# Patient Record
Sex: Female | Born: 1970 | Race: White | Hispanic: No | Marital: Married | Smoking: Never smoker
Health system: Southern US, Community
[De-identification: ages and names within clinical notes are randomized; demographics above are authoritative.]

## PROBLEM LIST (undated history)

## (undated) DIAGNOSIS — R7303 Prediabetes: Secondary | ICD-10-CM

## (undated) DIAGNOSIS — L409 Psoriasis, unspecified: Secondary | ICD-10-CM

## (undated) HISTORY — DX: Prediabetes: R73.03

## (undated) HISTORY — PX: TUBAL LIGATION: SHX77

## (undated) HISTORY — DX: Psoriasis, unspecified: L40.9

---

## 2008-03-20 ENCOUNTER — Ambulatory Visit: Payer: Self-pay | Admitting: Obstetrics & Gynecology

## 2009-10-25 LAB — HM PAP SMEAR

## 2011-01-25 ENCOUNTER — Ambulatory Visit: Payer: Self-pay | Admitting: Family Medicine

## 2012-08-31 ENCOUNTER — Ambulatory Visit (INDEPENDENT_AMBULATORY_CARE_PROVIDER_SITE_OTHER): Payer: BC Managed Care – PPO | Admitting: Internal Medicine

## 2012-08-31 ENCOUNTER — Encounter: Payer: Self-pay | Admitting: Internal Medicine

## 2012-08-31 VITALS — BP 120/78 | HR 63 | Temp 98.4°F | Ht 69.5 in | Wt 273.8 lb

## 2012-08-31 DIAGNOSIS — E669 Obesity, unspecified: Secondary | ICD-10-CM

## 2012-08-31 DIAGNOSIS — Z0000 Encounter for general adult medical examination without abnormal findings: Secondary | ICD-10-CM

## 2012-08-31 DIAGNOSIS — Z23 Encounter for immunization: Secondary | ICD-10-CM

## 2012-08-31 DIAGNOSIS — Z1239 Encounter for other screening for malignant neoplasm of breast: Secondary | ICD-10-CM

## 2012-08-31 LAB — TSH: TSH: 1.05 u[IU]/mL (ref 0.35–5.50)

## 2012-08-31 LAB — COMPREHENSIVE METABOLIC PANEL
ALT: 13 U/L (ref 0–35)
AST: 16 U/L (ref 0–37)
Alkaline Phosphatase: 58 U/L (ref 39–117)
CO2: 27 mEq/L (ref 19–32)
GFR: 90.14 mL/min (ref >60.00)
Sodium: 138 mEq/L (ref 135–145)
Total Bilirubin: 0.2 mg/dL — ABNORMAL LOW (ref 0.3–1.2)
Total Protein: 7.7 g/dL (ref 6.0–8.3)

## 2012-08-31 LAB — CBC WITH DIFFERENTIAL/PLATELET
Basophils Absolute: 0 10*3/uL (ref 0.0–0.1)
Eosinophils Absolute: 0.1 10*3/uL (ref 0.0–0.7)
HCT: 38.7 % (ref 36.0–46.0)
Lymphs Abs: 2.1 10*3/uL (ref 0.7–4.0)
MCHC: 32.1 g/dL (ref 30.0–36.0)
MCV: 82.5 fl (ref 78.0–100.0)
Monocytes Absolute: 0.6 10*3/uL (ref 0.1–1.0)
Platelets: 327 10*3/uL (ref 150.0–400.0)
RDW: 14.1 % (ref 11.5–14.6)

## 2012-08-31 LAB — LIPID PANEL: Cholesterol: 155 mg/dL (ref 0–200)

## 2012-08-31 NOTE — Assessment & Plan Note (Signed)
Mammogram scheduled today.  

## 2012-08-31 NOTE — Assessment & Plan Note (Signed)
BMI 39. Discussed healthy diet and caloric restriction. Discussed incorporating lean protein and reducing intake of processed carbohydrates. Also discussed increasing physical activity with goal of 5 hours per week. We discussed potential medications to help with appetite suppression, however patient like to hold off for now. Followup in one year or sooner as needed.

## 2012-08-31 NOTE — Progress Notes (Signed)
Subjective:    Patient ID: Erica Roy, female    DOB: 1970/11/17, 41 y.o.   MRN: [id number]  HPI 41 year old female presents to establish care. She reports that she is generally feeling well. She is very active caring for her two children who are 69 years old and 5 years old. 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 Her only concern is ongoing struggle with weight loss. She reports limited time for exercise because of multiple responsibilities. She tries to follow a healthy diet but does note some dietary indiscretion. She reports that she has lost weight in the past using Weight Watchers, however this was several years ago. She has not recently tried any dietary interventions. She has never taken medications to help with weight loss. Aside from this, she reports she is feeling well.  Outpatient Encounter Prescriptions as of 08/31/2012  Medication Sig Dispense Refill  . Multiple Vitamin (MULTIVITAMIN) tablet Take 1 tablet by mouth daily.       BP 120/78  Pulse 63  Temp 98.4 F (36.9 C) (Oral)  Ht 5' 9.5" (1.765 m)  Wt 273 lb 12 oz (124.172 kg)  BMI 39.85 kg/m2  SpO2 98%  LMP 08/23/2012  Review of Systems  Constitutional: Negative for fever, chills, appetite change, fatigue and unexpected weight change.  HENT: Negative for ear pain, congestion, sore throat, trouble swallowing, neck pain, voice change and sinus pressure.   Eyes: Negative for visual disturbance.  Respiratory: Negative for cough, shortness of breath, wheezing and stridor.   Cardiovascular: Negative for chest pain, palpitations and leg swelling.  Gastrointestinal: Negative for nausea, vomiting, abdominal pain, diarrhea, constipation, blood in stool, abdominal distention and anal bleeding.  Genitourinary: Negative for dysuria and flank pain.  Musculoskeletal: Negative for myalgias, arthralgias and gait problem.  Skin: Negative for color change and rash.  Neurological: Negative for dizziness  and headaches.  Hematological: Negative for adenopathy. Does not bruise/bleed easily.  Psychiatric/Behavioral: Negative for suicidal ideas, sleep disturbance and dysphoric mood. The patient is not nervous/anxious.        Objective:   Physical Exam  Constitutional: She is oriented to person, place, and time. She appears well-developed and well-nourished. No distress.  HENT:  Head: Normocephalic and atraumatic.  Right Ear: External ear normal.  Left Ear: External ear normal.  Nose: Nose normal.  Mouth/Throat: Oropharynx is clear and moist. No oropharyngeal exudate.  Eyes: Conjunctivae normal are normal. Pupils are equal, round, and reactive to light. Right eye exhibits no discharge. Left eye exhibits no discharge. No scleral icterus.  Neck: Normal range of motion. Neck supple. No tracheal deviation present. No thyromegaly present.  Cardiovascular: Normal rate, regular rhythm, normal heart sounds and intact distal pulses.  Exam reveals no gallop and no friction rub.   No murmur heard. Pulmonary/Chest: Effort normal and breath sounds normal. No respiratory distress. She has no wheezes. She has no rales. She exhibits no tenderness.  Abdominal: Soft. Bowel sounds are normal. She exhibits no distension and no mass. There is no tenderness. There is no rebound and no guarding.  Musculoskeletal: Normal range of motion. She exhibits no edema and no tenderness.  Lymphadenopathy:    She has no cervical adenopathy.  Neurological: She is alert and oriented to person, place, and time. No cranial nerve deficit. She exhibits normal muscle tone. Coordination normal.  Skin: Skin is warm and dry. No rash noted. She is not diaphoretic. No erythema. No pallor.  Psychiatric: She has a normal mood  and affect. Her behavior is normal. Judgment and thought content normal.          Assessment & Plan:

## 2012-09-01 LAB — VITAMIN D 25 HYDROXY (VIT D DEFICIENCY, FRACTURES): Vit D, 25-Hydroxy: 31 ng/mL (ref 30–89)

## 2012-09-04 ENCOUNTER — Ambulatory Visit (INDEPENDENT_AMBULATORY_CARE_PROVIDER_SITE_OTHER): Payer: BC Managed Care – PPO | Admitting: *Deleted

## 2012-09-04 DIAGNOSIS — Z111 Encounter for screening for respiratory tuberculosis: Secondary | ICD-10-CM

## 2012-09-07 LAB — TB SKIN TEST: TB Skin Test: NEGATIVE

## 2012-09-27 ENCOUNTER — Ambulatory Visit: Payer: Self-pay | Admitting: Internal Medicine

## 2012-10-06 ENCOUNTER — Ambulatory Visit (INDEPENDENT_AMBULATORY_CARE_PROVIDER_SITE_OTHER): Payer: BC Managed Care – PPO | Admitting: Internal Medicine

## 2012-10-06 ENCOUNTER — Encounter: Payer: Self-pay | Admitting: Internal Medicine

## 2012-10-06 VITALS — BP 122/76 | HR 71 | Temp 98.1°F | Ht 69.0 in | Wt 277.5 lb

## 2012-10-06 DIAGNOSIS — J0100 Acute maxillary sinusitis, unspecified: Secondary | ICD-10-CM

## 2012-10-06 MED ORDER — AMOXICILLIN-POT CLAVULANATE 875-125 MG PO TABS: 1.0000 | ORAL_TABLET | Freq: Two times a day (BID) | ORAL | Status: DC

## 2012-10-06 NOTE — Assessment & Plan Note (Signed)
Symptoms and exam are consistent with bilateral maxillary sinusitis. Will treat with Augmentin. Recommended that patient start Claritin-D 12 hour twice daily. Patient will call if symptoms are not improving over the next 72 hours. No improvement, would favor starting prednisone.

## 2012-10-06 NOTE — Progress Notes (Signed)
  Subjective:    Patient ID: Erica Roy, female    DOB: [birth year], 41 y.o.   MRN: [id number]  HPI 41 year old female presents for acute visit complaining of one-week history of sinus congestion, sinus pressure, and headache. She reports her symptoms began 1 week ago with fever, chills, general malaise, and nasal drainage. She had some occasional cough productive of purulent sputum. Over the course of the week, her symptoms persisted with increasing sinus pressure and maxillary she also reports pain in her teeth. She has had difficulty chewing because of sinus pain/pressure. She has been taking Ibuprofen 800mg  up to three times daily with no improvement.  Outpatient Encounter Prescriptions as of 10/06/2012  Medication Sig Dispense Refill  . Multiple Vitamin (MULTIVITAMIN) tablet Take 1 tablet by mouth daily.      Marland Kitchen amoxicillin-clavulanate (AUGMENTIN) 875-125 MG per tablet Take 1 tablet by mouth 2 (two) times daily.  20 tablet  0   BP 122/76  Pulse 71  Temp 98.1 F (36.7 C) (Oral)  Ht 5\' 9"  (1.753 m)  Wt 277 lb 8 oz (125.873 kg)  BMI 40.98 kg/m2  SpO2 98%  LMP 09/20/2012  Review of Systems  Constitutional: Positive for fever, chills and fatigue. Negative for unexpected weight change.  HENT: Positive for congestion, postnasal drip and sinus pressure. Negative for hearing loss, ear pain, nosebleeds, sore throat, facial swelling, rhinorrhea, sneezing, mouth sores, trouble swallowing, neck pain, neck stiffness, voice change, tinnitus and ear discharge.   Eyes: Negative for pain, discharge, redness and visual disturbance.  Respiratory: Positive for cough. Negative for chest tightness, shortness of breath, wheezing and stridor.   Cardiovascular: Negative for chest pain, palpitations and leg swelling.  Musculoskeletal: Negative for myalgias and arthralgias.  Skin: Negative for color change and rash.  Neurological: Positive for headaches. Negative for dizziness, weakness and light-headedness.   Hematological: Negative for adenopathy.       Objective:   Physical Exam  Constitutional: She is oriented to person, place, and time. She appears well-developed and well-nourished. No distress.  HENT:  Head: Normocephalic and atraumatic.  Right Ear: External ear normal. A middle ear effusion is present.  Left Ear: External ear normal. A middle ear effusion is present.  Nose: Mucosal edema present. Right sinus exhibits maxillary sinus tenderness. Left sinus exhibits maxillary sinus tenderness.  Mouth/Throat: Oropharynx is clear and moist. No oropharyngeal exudate.  Eyes: Conjunctivae normal are normal. Pupils are equal, round, and reactive to light. Right eye exhibits no discharge. Left eye exhibits no discharge. No scleral icterus.  Neck: Normal range of motion. Neck supple. No tracheal deviation present. No thyromegaly present.  Cardiovascular: Normal rate, regular rhythm, normal heart sounds and intact distal pulses.  Exam reveals no gallop and no friction rub.   No murmur heard. Pulmonary/Chest: Effort normal and breath sounds normal. No respiratory distress. She has no wheezes. She has no rales. She exhibits no tenderness.  Musculoskeletal: Normal range of motion. She exhibits no edema and no tenderness.  Lymphadenopathy:    She has no cervical adenopathy.  Neurological: She is alert and oriented to person, place, and time. No cranial nerve deficit. She exhibits normal muscle tone. Coordination normal.  Skin: Skin is warm and dry. No rash noted. She is not diaphoretic. No erythema. No pallor.  Psychiatric: She has a normal mood and affect. Her behavior is normal. Judgment and thought content normal.          Assessment & Plan:

## 2012-10-06 NOTE — Patient Instructions (Signed)
Start Augmentin and Claritin-D 12 hour for congestion. Continue Ibuprofen. Call or email Monday if no improvement.

## 2012-10-09 ENCOUNTER — Encounter: Payer: Self-pay | Admitting: Internal Medicine

## 2013-05-16 ENCOUNTER — Encounter (HOSPITAL_COMMUNITY): Payer: Self-pay | Admitting: Pharmacy Technician

## 2013-08-30 ENCOUNTER — Other Ambulatory Visit: Payer: Self-pay

## 2013-10-08 ENCOUNTER — Ambulatory Visit (INDEPENDENT_AMBULATORY_CARE_PROVIDER_SITE_OTHER): Payer: BC Managed Care – PPO | Admitting: Internal Medicine

## 2013-10-08 ENCOUNTER — Encounter: Payer: Self-pay | Admitting: Internal Medicine

## 2013-10-08 ENCOUNTER — Encounter: Payer: Self-pay | Admitting: Emergency Medicine

## 2013-10-08 ENCOUNTER — Other Ambulatory Visit (HOSPITAL_COMMUNITY)
Admission: RE | Admit: 2013-10-08 | Discharge: 2013-10-08 | Disposition: A | Discharge status: Home / Self Care | Payer: BC Managed Care – PPO | Source: Ambulatory Visit | Attending: Internal Medicine | Admitting: Internal Medicine

## 2013-10-08 VITALS — BP 120/70 | HR 70 | Temp 98.0°F | Ht 69.0 in

## 2013-10-08 DIAGNOSIS — Z1151 Encounter for screening for human papillomavirus (HPV): Secondary | ICD-10-CM | POA: Insufficient documentation

## 2013-10-08 DIAGNOSIS — Z0000 Encounter for general adult medical examination without abnormal findings: Secondary | ICD-10-CM | POA: Insufficient documentation

## 2013-10-08 DIAGNOSIS — E669 Obesity, unspecified: Secondary | ICD-10-CM

## 2013-10-08 DIAGNOSIS — E663 Overweight: Secondary | ICD-10-CM

## 2013-10-08 DIAGNOSIS — Z01419 Encounter for gynecological examination (general) (routine) without abnormal findings: Secondary | ICD-10-CM | POA: Insufficient documentation

## 2013-10-08 DIAGNOSIS — Z1239 Encounter for other screening for malignant neoplasm of breast: Secondary | ICD-10-CM

## 2013-10-08 LAB — CBC WITH DIFFERENTIAL/PLATELET
Eosinophils Relative: 1.7 % (ref 0.0–5.0)
HCT: 36.7 % (ref 36.0–46.0)
Hemoglobin: 12.2 g/dL (ref 12.0–15.0)
Lymphs Abs: 2 10*3/uL (ref 0.7–4.0)
Monocytes Relative: 6.4 % (ref 3.0–12.0)
Neutro Abs: 6.2 10*3/uL (ref 1.4–7.7)
RBC: 4.65 Mil/uL (ref 3.87–5.11)
WBC: 9 10*3/uL (ref 4.5–10.5)

## 2013-10-08 LAB — LIPID PANEL
HDL: 37.2 mg/dL — ABNORMAL LOW (ref >39.00)
LDL Cholesterol: 107 mg/dL — ABNORMAL HIGH (ref 0–99)
Total CHOL/HDL Ratio: 4
Triglycerides: 70 mg/dL (ref 0.0–149.0)

## 2013-10-08 LAB — COMPREHENSIVE METABOLIC PANEL
CO2: 25 mEq/L (ref 19–32)
Creatinine, Ser: 0.8 mg/dL (ref 0.4–1.2)
GFR: 79.77 mL/min (ref >60.00)
Glucose, Bld: 92 mg/dL (ref 70–99)
Total Bilirubin: 0.3 mg/dL (ref 0.3–1.2)

## 2013-10-08 LAB — HM PAP SMEAR: HM PAP: NEGATIVE

## 2013-10-08 MED ORDER — PHENTERMINE HCL 37.5 MG PO CAPS: 37.5000 mg | ORAL_CAPSULE | ORAL | Status: DC

## 2013-10-08 NOTE — Assessment & Plan Note (Signed)
General medical exam normal today including breast and pelvic exam. PAP pending. Immunizations are UTD. Will check labs including CBC, CMP, lipids, TSH, Vit D. Encouraged healthy diet and regular exercise with goal of weight loss. Mammogram ordered.

## 2013-10-08 NOTE — Addendum Note (Signed)
Addended by: Montine Circle D on: 10/08/2013 10:02 AM   Modules accepted: Orders

## 2013-10-08 NOTE — Progress Notes (Signed)
Subjective:    Patient ID: Erica Roy, female    DOB: 1971/02/01, 42 y.o.   MRN: [id number]  HPI 42YO female presents for annual exam. Feeling well. Continues to struggle with weight loss. Has tried Weight Watchers with no improvement. Signed up with personal trainer 3days per week. Trying to limit carbohydrates.  No other concerns today.  Outpatient Encounter Prescriptions as of 10/08/2013  Medication Sig  . Multiple Vitamin (MULTIVITAMIN) tablet Take 1 tablet by mouth daily.   BP 120/70  Pulse 70  Temp(Src) 98 F (36.7 C) (Oral)  Ht 5\' 9"  (1.753 m)  SpO2 98%  LMP 09/04/2013  Review of Systems  Constitutional: Negative for fever, chills, appetite change, fatigue and unexpected weight change.  HENT: Negative for congestion, ear pain, sinus pressure, sore throat, trouble swallowing and voice change.   Eyes: Negative for visual disturbance.  Respiratory: Negative for cough, shortness of breath, wheezing and stridor.   Cardiovascular: Negative for chest pain, palpitations and leg swelling.  Gastrointestinal: Negative for nausea, vomiting, abdominal pain, diarrhea, constipation, blood in stool, abdominal distention and anal bleeding.  Genitourinary: Negative for dysuria and flank pain.  Musculoskeletal: Negative for arthralgias, gait problem, myalgias and neck pain.  Skin: Negative for color change and rash.  Neurological: Negative for dizziness and headaches.  Hematological: Negative for adenopathy. Does not bruise/bleed easily.  Psychiatric/Behavioral: Negative for suicidal ideas, sleep disturbance and dysphoric mood. The patient is not nervous/anxious.        Objective:   Physical Exam  Constitutional: She is oriented to person, place, and time. She appears well-developed and well-nourished. No distress.  HENT:  Head: Normocephalic and atraumatic.  Right Ear: External ear normal.  Left Ear: External ear normal.  Nose: Nose normal.  Mouth/Throat: Oropharynx is clear and  moist. No oropharyngeal exudate.  Eyes: Conjunctivae are normal. Pupils are equal, round, and reactive to light. Right eye exhibits no discharge. Left eye exhibits no discharge. No scleral icterus.  Neck: Normal range of motion. Neck supple. No tracheal deviation present. No thyromegaly present.  Cardiovascular: Normal rate, regular rhythm, normal heart sounds and intact distal pulses.  Exam reveals no gallop and no friction rub.   No murmur heard. Pulmonary/Chest: Effort normal and breath sounds normal. No accessory muscle usage. Not tachypneic. No respiratory distress. She has no decreased breath sounds. She has no wheezes. She has no rhonchi. She has no rales. She exhibits no tenderness.  Abdominal: Soft. Bowel sounds are normal. She exhibits no distension and no mass. There is no tenderness. There is no rebound and no guarding.  Genitourinary: Rectum normal, vagina normal and uterus normal. No breast swelling, tenderness, discharge or bleeding. Pelvic exam was performed with patient supine. There is no rash, tenderness or lesion on the right labia. There is no rash, tenderness or lesion on the left labia. Uterus is not enlarged and not tender. Cervix exhibits no motion tenderness, no discharge and no friability. Right adnexum displays no mass, no tenderness and no fullness. Left adnexum displays no mass, no tenderness and no fullness. No erythema or tenderness around the vagina. No vaginal discharge found.  Musculoskeletal: Normal range of motion. She exhibits no edema and no tenderness.  Lymphadenopathy:    She has no cervical adenopathy.  Neurological: She is alert and oriented to person, place, and time. No cranial nerve deficit. She exhibits normal muscle tone. Coordination normal.  Skin: Skin is warm and dry. No rash noted. She is not diaphoretic. No erythema. No pallor.  Psychiatric: She has a normal mood and affect. Her behavior is normal. Judgment and thought content normal.            Assessment & Plan:

## 2013-10-08 NOTE — Progress Notes (Signed)
Pre-visit discussion using our clinic review tool. No additional management support is needed unless otherwise documented below in the visit note.  

## 2013-10-08 NOTE — Assessment & Plan Note (Signed)
Will start phentermine to help with appetite suppression. Plan follow up in 4 weeks for weight and BP check. (note that we could not recheck weight today because scale not working, but pt estimates 278lb)

## 2013-10-09 ENCOUNTER — Encounter: Payer: Self-pay | Admitting: *Deleted

## 2013-10-11 ENCOUNTER — Ambulatory Visit: Payer: Self-pay | Admitting: Internal Medicine

## 2013-10-11 LAB — HM MAMMOGRAPHY: HM Mammogram: NORMAL

## 2013-11-05 ENCOUNTER — Ambulatory Visit (INDEPENDENT_AMBULATORY_CARE_PROVIDER_SITE_OTHER): Payer: BC Managed Care – PPO | Admitting: Internal Medicine

## 2013-11-05 ENCOUNTER — Encounter: Payer: Self-pay | Admitting: Internal Medicine

## 2013-11-05 VITALS — BP 110/70 | HR 79 | Temp 98.2°F | Ht 69.0 in | Wt 275.5 lb

## 2013-11-05 DIAGNOSIS — E663 Overweight: Secondary | ICD-10-CM

## 2013-11-05 DIAGNOSIS — E669 Obesity, unspecified: Secondary | ICD-10-CM

## 2013-11-05 MED ORDER — PHENTERMINE HCL 37.5 MG PO CAPS: 37.5000 mg | ORAL_CAPSULE | ORAL | Status: DC

## 2013-11-05 NOTE — Progress Notes (Signed)
   Subjective:    Patient ID: Erica Roy, female    DOB: 07/08/1971, 43 y.o.   MRN: [id number]  HPI 43 year old female with history of obesity presents for followup. At her last visit, she was started on phentermine. She reports she has tolerated this medication well. She has noted dry mouth and has increased her fluid intake. She continues to try to follow a healthy diet. She is exercising with a trainer once per week and has started a fitness challenge with her husband. She is feeling well and denies any new concerns today.  Outpatient Encounter Prescriptions as of 11/05/2013  Medication Sig  . Multiple Vitamin (MULTIVITAMIN) tablet Take 1 tablet by mouth daily.  . phentermine 37.5 MG capsule Take 1 capsule (37.5 mg total) by mouth every morning.   BP 110/70  Pulse 79  Temp(Src) 98.2 F (36.8 C) (Oral)  Ht 5\' 9"  (1.753 m)  Wt 275 lb 8 oz (124.966 kg)  BMI 40.67 kg/m2  SpO2 96%  LMP 09/04/2013  Review of Systems  Constitutional: Negative for fever, chills, appetite change, fatigue and unexpected weight change.  HENT: Negative for congestion, ear pain, sinus pressure, sore throat, trouble swallowing and voice change.   Eyes: Negative for visual disturbance.  Respiratory: Negative for cough, shortness of breath, wheezing and stridor.   Cardiovascular: Negative for chest pain, palpitations and leg swelling.  Gastrointestinal: Negative for nausea, vomiting, abdominal pain, diarrhea, constipation, blood in stool, abdominal distention and anal bleeding.  Genitourinary: Negative for dysuria and flank pain.  Musculoskeletal: Negative for arthralgias, gait problem, myalgias and neck pain.  Skin: Negative for color change and rash.  Neurological: Negative for dizziness and headaches.  Hematological: Negative for adenopathy. Does not bruise/bleed easily.  Psychiatric/Behavioral: Negative for suicidal ideas, sleep disturbance and dysphoric mood. The patient is not nervous/anxious.          Objective:   Physical Exam  Constitutional: She is oriented to person, place, and time. She appears well-developed and well-nourished. No distress.  HENT:  Head: Normocephalic and atraumatic.  Right Ear: External ear normal.  Left Ear: External ear normal.  Nose: Nose normal.  Mouth/Throat: Oropharynx is clear and moist. No oropharyngeal exudate.  Eyes: Conjunctivae are normal. Pupils are equal, round, and reactive to light. Right eye exhibits no discharge. Left eye exhibits no discharge. No scleral icterus.  Neck: Normal range of motion. Neck supple. No tracheal deviation present. No thyromegaly present.  Cardiovascular: Normal rate, regular rhythm, normal heart sounds and intact distal pulses.  Exam reveals no gallop and no friction rub.   No murmur heard. Pulmonary/Chest: Effort normal and breath sounds normal. No accessory muscle usage. Not tachypneic. No respiratory distress. She has no decreased breath sounds. She has no wheezes. She has no rhonchi. She has no rales. She exhibits no tenderness.  Musculoskeletal: Normal range of motion. She exhibits no edema and no tenderness.  Lymphadenopathy:    She has no cervical adenopathy.  Neurological: She is alert and oriented to person, place, and time. No cranial nerve deficit. She exhibits normal muscle tone. Coordination normal.  Skin: Skin is warm and dry. No rash noted. She is not diaphoretic. No erythema. No pallor.  Psychiatric: She has a normal mood and affect. Her behavior is normal. Judgment and thought content normal.          Assessment & Plan:

## 2013-11-05 NOTE — Progress Notes (Signed)
Pre-visit discussion using our clinic review tool. No additional management support is needed unless otherwise documented below in the visit note.  

## 2013-11-05 NOTE — Assessment & Plan Note (Signed)
Wt Readings from Last 3 Encounters:  11/05/13 275 lb 8 oz (124.966 kg)  10/06/12 277 lb 8 oz (125.873 kg)  08/31/12 273 lb 12 oz (124.172 kg)   Body mass index is 40.67 kg/(m^2). Congratulated patient on weight loss. Encouraged her to continue efforts at healthy diet and regular physical activity. Will continue phentermine for appetite suppression. Plan followup in 2-3 months.

## 2013-11-07 ENCOUNTER — Encounter: Payer: Self-pay | Admitting: Internal Medicine

## 2014-02-08 ENCOUNTER — Ambulatory Visit: Payer: BC Managed Care – PPO | Admitting: Internal Medicine

## 2014-06-25 ENCOUNTER — Ambulatory Visit (INDEPENDENT_AMBULATORY_CARE_PROVIDER_SITE_OTHER): Payer: BC Managed Care – PPO | Admitting: *Deleted

## 2014-06-25 DIAGNOSIS — Z111 Encounter for screening for respiratory tuberculosis: Secondary | ICD-10-CM

## 2014-06-27 LAB — TB SKIN TEST
Induration: 0 mm
TB Skin Test: NEGATIVE

## 2014-10-10 ENCOUNTER — Encounter: Payer: BC Managed Care – PPO | Admitting: Internal Medicine

## 2014-10-11 ENCOUNTER — Ambulatory Visit (INDEPENDENT_AMBULATORY_CARE_PROVIDER_SITE_OTHER): Payer: BC Managed Care – PPO | Admitting: Internal Medicine

## 2014-10-11 ENCOUNTER — Encounter: Payer: Self-pay | Admitting: Internal Medicine

## 2014-10-11 VITALS — BP 118/76 | HR 78 | Temp 98.2°F | Ht 69.25 in | Wt 283.5 lb

## 2014-10-11 DIAGNOSIS — L219 Seborrheic dermatitis, unspecified: Secondary | ICD-10-CM | POA: Insufficient documentation

## 2014-10-11 DIAGNOSIS — Z0000 Encounter for general adult medical examination without abnormal findings: Secondary | ICD-10-CM

## 2014-10-11 DIAGNOSIS — L218 Other seborrheic dermatitis: Secondary | ICD-10-CM

## 2014-10-11 LAB — CBC WITH DIFFERENTIAL/PLATELET
Basophils Absolute: 0 10*3/uL (ref 0.0–0.1)
Basophils Relative: 0.3 % (ref 0.0–3.0)
EOS PCT: 0.7 % (ref 0.0–5.0)
Eosinophils Absolute: 0 10*3/uL (ref 0.0–0.7)
HCT: 37.7 % (ref 36.0–46.0)
Hemoglobin: 12 g/dL (ref 12.0–15.0)
Lymphocytes Relative: 24.1 % (ref 12.0–46.0)
Lymphs Abs: 1.8 10*3/uL (ref 0.7–4.0)
MCHC: 31.9 g/dL (ref 30.0–36.0)
MCV: 80.7 fl (ref 78.0–100.0)
Monocytes Absolute: 0.5 10*3/uL (ref 0.1–1.0)
Monocytes Relative: 6.3 % (ref 3.0–12.0)
NEUTROS PCT: 68.6 % (ref 43.0–77.0)
Neutro Abs: 5.1 10*3/uL (ref 1.4–7.7)
PLATELETS: 314 10*3/uL (ref 150.0–400.0)
RBC: 4.67 Mil/uL (ref 3.87–5.11)
RDW: 14.7 % (ref 11.5–15.5)
WBC: 7.4 10*3/uL (ref 4.0–10.5)

## 2014-10-11 LAB — LIPID PANEL
Cholesterol: 159 mg/dL (ref 0–200)
HDL: 35.4 mg/dL — ABNORMAL LOW (ref >39.00)
LDL Cholesterol: 97 mg/dL (ref 0–99)
NonHDL: 123.6
Total CHOL/HDL Ratio: 4
Triglycerides: 133 mg/dL (ref 0.0–149.0)
VLDL: 26.6 mg/dL (ref 0.0–40.0)

## 2014-10-11 LAB — COMPREHENSIVE METABOLIC PANEL
ALT: 13 U/L (ref 0–35)
AST: 18 U/L (ref 0–37)
Albumin: 3.9 g/dL (ref 3.5–5.2)
Alkaline Phosphatase: 61 U/L (ref 39–117)
BUN: 13 mg/dL (ref 6–23)
CALCIUM: 9 mg/dL (ref 8.4–10.5)
CO2: 25 meq/L (ref 19–32)
Chloride: 104 mEq/L (ref 96–112)
Creatinine, Ser: 0.7 mg/dL (ref 0.4–1.2)
GFR: 96.64 mL/min (ref >60.00)
Glucose, Bld: 127 mg/dL — ABNORMAL HIGH (ref 70–99)
Potassium: 3.8 mEq/L (ref 3.5–5.1)
SODIUM: 137 meq/L (ref 135–145)
TOTAL PROTEIN: 7.1 g/dL (ref 6.0–8.3)
Total Bilirubin: 0.7 mg/dL (ref 0.2–1.2)

## 2014-10-11 LAB — HEMOGLOBIN A1C: HEMOGLOBIN A1C: 6.7 % — AB (ref 4.6–6.5)

## 2014-10-11 LAB — MICROALBUMIN / CREATININE URINE RATIO
Creatinine,U: 167.7 mg/dL
Microalb Creat Ratio: 0.3 mg/g (ref 0.0–30.0)
Microalb, Ur: 0.5 mg/dL (ref 0.0–1.9)

## 2014-10-11 MED ORDER — SELENIUM SULFIDE 2.25 % EX SHAM: 1.0000 "application " | MEDICATED_SHAMPOO | CUTANEOUS | Status: DC
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
## 2014-10-11 NOTE — Assessment & Plan Note (Signed)
Will start Selenium sulfide shampoo. Pt will call if no improvement.

## 2014-10-11 NOTE — Assessment & Plan Note (Signed)
General medical exam normal today including breast exam. PAP and pelvic deferred as PAP normal 2014, HPV neg. Immunizations are UTD. Mammogram ordered. Labs today CBC, CMP, lipids, TSH, A1c. Follow up 1 year and prn.

## 2014-10-11 NOTE — Progress Notes (Signed)
Pre visit review using our clinic review tool, if applicable. No additional management support is needed unless otherwise documented below in the visit note. 

## 2014-10-11 NOTE — Patient Instructions (Addendum)
Consider nutrition counseling either at [facility] or at [facility].  Health Maintenance Adopting a healthy lifestyle and getting preventive care can go a long way to promote health and wellness. Talk with your health care provider about what schedule of regular examinations is right for you. This is a good chance for you to check in with your provider about disease prevention and staying healthy. In between checkups, there are plenty of things you can do on your own. Experts have done a lot of research about which lifestyle changes and preventive measures are most likely to keep you healthy. Ask your health care provider for more information. WEIGHT AND DIET  Eat a healthy diet  Be sure to include plenty of vegetables, fruits, low-fat dairy products, and lean protein.  Do not eat a lot of foods high in solid fats, added sugars, or salt.  Get regular exercise. This is one of the most important things you can do for your health.  Most adults should exercise for at least 150 minutes each week. The exercise should increase your heart rate and make you sweat (moderate-intensity exercise).  Most adults should also do strengthening exercises at least twice a week. This is in addition to the moderate-intensity exercise.  Maintain a healthy weight  Body mass index (BMI) is a measurement that can be used to identify possible weight problems. It estimates body fat based on height and weight. Your health care provider can help determine your BMI and help you achieve or maintain a healthy weight.  For females 43 years of age and older:   A BMI below 18.5 is considered underweight.  A BMI of 18.5 to 24.9 is normal.  A BMI of 25 to 29.9 is considered overweight.  A BMI of 30 and above is considered obese.  Watch levels of cholesterol and blood lipids  You should start having your blood tested for lipids and cholesterol at 43 years of age, then have this test every 5 years.  You may need to  have your cholesterol levels checked more often if:  Your lipid or cholesterol levels are high.  You are older than 43 years of age.  You are at high risk for heart disease.  CANCER SCREENING   Lung Cancer  Lung cancer screening is recommended for adults 43-45 years old who are at high risk for lung cancer because of a history of smoking.  A yearly low-dose CT scan of the lungs is recommended for people who:  Currently smoke.  Have quit within the past 15 years.  Have at least a 30-pack-year history of smoking. A pack year is smoking an average of one pack of cigarettes a day for 1 year.  Yearly screening should continue until it has been 15 years since you quit.  Yearly screening should stop if you develop a health problem that would prevent you from having lung cancer treatment.  Breast Cancer  Practice breast self-awareness. This means understanding how your breasts normally appear and feel.  It also means doing regular breast self-exams. Let your health care provider know about any changes, no matter how small.  If you are in your 43s or 30s, you should have a clinical breast exam (CBE) by a health care provider every 1-3 years as part of a regular health exam.  If you are 43 or older, have a CBE every year. Also consider having a breast X-ray (mammogram) every year.  If you have a family history of breast cancer, talk to  care provider about genetic screening.  If you are at high risk for breast cancer, talk to your health care provider about having an MRI and a mammogram every year.  Breast cancer gene (BRCA) assessment is recommended for women who have family members with BRCA-related cancers. BRCA-related cancers include:  Breast.  Ovarian.  Tubal.  Peritoneal cancers.  Results of the assessment will determine the need for genetic counseling and BRCA1 and BRCA2 testing. Cervical Cancer Routine pelvic examinations to screen for cervical cancer are no  longer recommended for nonpregnant women who are considered low risk for cancer of the pelvic organs (ovaries, uterus, and vagina) and who do not have symptoms. A pelvic examination may be necessary if you have symptoms including those associated with pelvic infections. Ask your health care provider if a screening pelvic exam is right for you.   The Pap test is the screening test for cervical cancer for women who are considered at risk.  If you had a hysterectomy for a problem that was not cancer or a condition that could lead to cancer, then you no longer need Pap tests.  If you are older than 65 years, and you have had normal Pap tests for the past 10 years, you no longer need to have Pap tests.  If you have had past treatment for cervical cancer or a condition that could lead to cancer, you need Pap tests and screening for cancer for at least 20 years after your treatment.  If you no longer get a Pap test, assess your risk factors if they change (such as having a new sexual partner). This can affect whether you should start being screened again.  Some women have medical problems that increase their chance of getting cervical cancer. If this is the case for you, your health care provider may recommend more frequent screening and Pap tests.  The human papillomavirus (HPV) test is another test that may be used for cervical cancer screening. The HPV test looks for the virus that can cause cell changes in the cervix. The cells collected during the Pap test can be tested for HPV.  The HPV test can be used to screen women 43 years of age and older. Getting tested for HPV can extend the interval between normal Pap tests from three to five years.  An HPV test also should be used to screen women of any age who have unclear Pap test results.  After 43 years of age, women should have HPV testing as often as Pap tests.  Colorectal Cancer  This type of cancer can be detected and often  prevented.  Routine colorectal cancer screening usually begins at 43 years of age and continues through 43 years of age.  Your health care provider may recommend screening at an earlier age if you have risk factors for colon cancer.  Your health care provider may also recommend using home test kits to check for hidden blood in the stool.  A small camera at the end of a tube can be used to examine your colon directly (sigmoidoscopy or colonoscopy). This is done to check for the earliest forms of colorectal cancer.  Routine screening usually begins at age 50.  Direct examination of the colon should be repeated every 5-10 years through 43 years of age. However, you may need to be screened more often if early forms of precancerous polyps or small growths are found. Skin Cancer  Check your skin from head to toe regularly.  Tell your health   your health care provider about any new moles or changes in moles, especially if there is a change in a mole's shape or color.  Also tell your health care provider if you have a mole that is larger than the size of a pencil eraser.  Always use sunscreen. Apply sunscreen liberally and repeatedly throughout the day.  Protect yourself by wearing long sleeves, pants, a wide-brimmed hat, and sunglasses whenever you are outside. HEART DISEASE, DIABETES, AND HIGH BLOOD PRESSURE   Have your blood pressure checked at least every 1-2 years. High blood pressure causes heart disease and increases the risk of stroke.  If you are between 1 years and 61 years old, ask your health care provider if you should take aspirin to prevent strokes.  Have regular diabetes screenings. This involves taking a blood sample to check your fasting blood sugar level.  If you are at a normal weight and have a low risk for diabetes, have this test once every three years after 43 years of age.  If you are overweight and have a high risk for diabetes, consider being tested at a younger age or more  often. PREVENTING INFECTION  Hepatitis B  If you have a higher risk for hepatitis B, you should be screened for this virus. You are considered at high risk for hepatitis B if:  You were born in a country where hepatitis B is common. Ask your health care provider which countries are considered high risk.  Your parents were born in a high-risk country, and you have not been immunized against hepatitis B (hepatitis B vaccine).  You have HIV or AIDS.  You use needles to inject street drugs.  You live with someone who has hepatitis B.  You have had sex with someone who has hepatitis B.  You get hemodialysis treatment.  You take certain medicines for conditions, including cancer, organ transplantation, and autoimmune conditions. Hepatitis C  Blood testing is recommended for:  Everyone born from [birth year] through 1965.  Anyone with known risk factors for hepatitis C. Sexually transmitted infections (STIs)  You should be screened for sexually transmitted infections (STIs) including gonorrhea and chlamydia if:  You are sexually active and are younger than 43 years of age.  You are older than 43 years of age and your health care provider tells you that you are at risk for this type of infection.  Your sexual activity has changed since you were last screened and you are at an increased risk for chlamydia or gonorrhea. Ask your health care provider if you are at risk.  If you do not have HIV, but are at risk, it may be recommended that you take a prescription medicine daily to prevent HIV infection. This is called pre-exposure prophylaxis (PrEP). You are considered at risk if:  You are sexually active and do not regularly use condoms or know the HIV status of your partner(s).  You take drugs by injection.  You are sexually active with a partner who has HIV. Talk with your health care provider about whether you are at high risk of being infected with HIV. If you choose to begin PrEP, you  should first be tested for HIV. You should then be tested every 3 months for as long as you are taking PrEP.  PREGNANCY   If you are premenopausal and you may become pregnant, ask your health care provider about preconception counseling.  If you may become pregnant, take 400 to 800 micrograms (mcg) of folic acid every  day.  If you want to prevent pregnancy, talk to your health care provider about birth control (contraception). OSTEOPOROSIS AND MENOPAUSE   Osteoporosis is a disease in which the bones lose minerals and strength with aging. This can result in serious bone fractures. Your risk for osteoporosis can be identified using a bone density scan.  If you are 5 years of age or older, or if you are at risk for osteoporosis and fractures, ask your health care provider if you should be screened.  Ask your health care provider whether you should take a calcium or vitamin D supplement to lower your risk for osteoporosis.  Menopause may have certain physical symptoms and risks.  Hormone replacement therapy may reduce some of these symptoms and risks. Talk to your health care provider about whether hormone replacement therapy is right for you.  HOME CARE INSTRUCTIONS   Schedule regular health, dental, and eye exams.  Stay current with your immunizations.   Do not use any tobacco products including cigarettes, chewing tobacco, or electronic cigarettes.  If you are pregnant, do not drink alcohol.  If you are breastfeeding, limit how much and how often you drink alcohol.  Limit alcohol intake to no more than 1 drink per day for nonpregnant women. One drink equals 12 ounces of beer, 5 ounces of wine, or 1 ounces of hard liquor.  Do not use street drugs.  Do not share needles.  Ask your health care provider for help if you need support or information about quitting drugs.  Tell your health care provider if you often feel depressed.  Tell your health care provider if you have ever  been abused or do not feel safe at home. Document Released: 04/26/2011 Document Revised: 02/25/2014 Document Reviewed: 09/12/2013 [facility] Patient Information 2015 [place], [place]. This information is not intended to replace advice given to you by your health care provider. Make sure you discuss any questions you have with your health care provider.

## 2014-10-11 NOTE — Assessment & Plan Note (Signed)
Wt Readings from Last 3 Encounters:  10/11/14 283 lb 8 oz (128.595 kg)  11/05/13 275 lb 8 oz (124.966 kg)  10/06/12 277 lb 8 oz (125.873 kg)   Body mass index is 41.56 kg/(m^2). The patient is asked to make an attempt to improve diet and exercise patterns to aid in medical management of this problem.

## 2014-10-14 ENCOUNTER — Other Ambulatory Visit: Payer: Self-pay | Admitting: Internal Medicine

## 2014-10-14 LAB — MEASLES/MUMPS/RUBELLA IMMUNITY
Mumps IgG: 8.09 AU/mL (ref <9.00)
Rubella: 3.77 Index — ABNORMAL HIGH (ref <0.90)
Rubeola IgG: 215 AU/mL — ABNORMAL HIGH (ref <25.00)

## 2014-10-14 LAB — VARICELLA ZOSTER ANTIBODY, IGG: Varicella IgG: 131.3 Index (ref <135.00)

## 2014-10-15 LAB — TSH: TSH: 1.044 u[IU]/mL (ref 0.350–4.500)

## 2014-10-15 LAB — VITAMIN D 25 HYDROXY (VIT D DEFICIENCY, FRACTURES): Vit D, 25-Hydroxy: 20 ng/mL — ABNORMAL LOW (ref 30–100)

## 2014-11-08 ENCOUNTER — Encounter: Payer: Self-pay | Admitting: Internal Medicine

## 2014-11-08 ENCOUNTER — Ambulatory Visit (INDEPENDENT_AMBULATORY_CARE_PROVIDER_SITE_OTHER): Payer: BC Managed Care – PPO | Admitting: Internal Medicine

## 2014-11-08 VITALS — BP 120/74 | HR 77 | Temp 97.6°F | Ht 69.25 in | Wt 271.2 lb

## 2014-11-08 DIAGNOSIS — E669 Obesity, unspecified: Secondary | ICD-10-CM

## 2014-11-08 DIAGNOSIS — R739 Hyperglycemia, unspecified: Secondary | ICD-10-CM | POA: Insufficient documentation

## 2014-11-08 DIAGNOSIS — E119 Type 2 diabetes mellitus without complications: Secondary | ICD-10-CM

## 2014-11-08 NOTE — Progress Notes (Signed)
Pre visit review using our clinic review tool, if applicable. No additional management support is needed unless otherwise documented below in the visit note. 

## 2014-11-08 NOTE — Assessment & Plan Note (Signed)
Wt Readings from Last 3 Encounters:  11/08/14 271 lb 4 oz (123.038 kg)  10/11/14 283 lb 8 oz (128.595 kg)  11/05/13 275 lb 8 oz (124.966 kg)   Body mass index is 39.77 kg/(m^2). Congratulated pt on weight loss. Encouraged continued healthy diet and exercise.

## 2014-11-08 NOTE — Patient Instructions (Signed)
Labs and follow up in 3 months

## 2014-11-08 NOTE — Assessment & Plan Note (Signed)
Lab Results  Component Value Date   HGBA1C 6.7* 10/11/2014   Will plan to repeat A1c in 3 months. Encouraged continued effort at healthy diet and exercise.

## 2014-11-08 NOTE — Progress Notes (Signed)
Subjective:    Patient ID: Erica ArgueJeanne B Roy, female    DOB: 02/05/1971, 44 y.o.   MRN: [id number]  HPI 44YO female presents for follow up.  Last seen 09/2015 for physical exam. Blood work showed A1c 6.7%. Started diet plan. Started probiotic.Started taking MSM and liver detox supplement.  Following clean diet, limiting portion size, limiting carbs and starches. Diet with grilled organic chicken, fresh fruits and veggies. Using unsweetened coconut milk. Weighs daily. Limited eating out. Energy level improved. Started exercising 3 times week at gym.   Wt Readings from Last 3 Encounters:  11/08/14 271 lb 4 oz (123.038 kg)  10/11/14 283 lb 8 oz (128.595 kg)  11/05/13 275 lb 8 oz (124.966 kg)    Past medical, surgical, family and social history per today's encounter.  Review of Systems  Constitutional: Negative for fever, chills, appetite change, fatigue and unexpected weight change.  Eyes: Negative for visual disturbance.  Respiratory: Negative for shortness of breath.   Cardiovascular: Negative for chest pain and leg swelling.  Gastrointestinal: Negative for nausea, vomiting, abdominal pain, diarrhea and constipation.  Musculoskeletal: Negative for myalgias and arthralgias.  Skin: Negative for color change and rash.  Hematological: Negative for adenopathy. Does not bruise/bleed easily.  Psychiatric/Behavioral: Negative for dysphoric mood. The patient is not nervous/anxious.        Objective:    BP 120/74 mmHg  Pulse 77  Temp(Src) 97.6 F (36.4 C) (Oral)  Ht 5' 9.25" (1.759 m)  Wt 271 lb 4 oz (123.038 kg)  BMI 39.77 kg/m2  SpO2 99%  LMP 11/08/2014 Physical Exam  Constitutional: She is oriented to person, place, and time. She appears well-developed and well-nourished. No distress.  HENT:  Head: Normocephalic and atraumatic.  Right Ear: External ear normal.  Left Ear: External ear normal.  Nose: Nose normal.  Mouth/Throat: Oropharynx is clear and moist. No  oropharyngeal exudate.  Eyes: Conjunctivae are normal. Pupils are equal, round, and reactive to light. Right eye exhibits no discharge. Left eye exhibits no discharge. No scleral icterus.  Neck: Normal range of motion. Neck supple. No tracheal deviation present. No thyromegaly present.  Cardiovascular: Normal rate, regular rhythm, normal heart sounds and intact distal pulses.  Exam reveals no gallop and no friction rub.   No murmur heard. Pulmonary/Chest: Effort normal and breath sounds normal. No accessory muscle usage. No tachypnea. No respiratory distress. She has no decreased breath sounds. She has no wheezes. She has no rhonchi. She has no rales. She exhibits no tenderness.  Musculoskeletal: Normal range of motion. She exhibits no edema or tenderness.  Lymphadenopathy:    She has no cervical adenopathy.  Neurological: She is alert and oriented to person, place, and time. No cranial nerve deficit. She exhibits normal muscle tone. Coordination normal.  Skin: Skin is warm and dry. No rash noted. She is not diaphoretic. No erythema. No pallor.  Psychiatric: She has a normal mood and affect. Her behavior is normal. Judgment and thought content normal.          Assessment & Plan:  Over 25min of which >50% spent in face-to-face contact with patient discussing plan of care  Problem List Items Addressed This Visit      Unprioritized   Diabetes mellitus type 2, controlled - Primary    Lab Results  Component Value Date   HGBA1C 6.7* 10/11/2014   Will plan to repeat A1c in 3 months. Encouraged continued effort at healthy diet and exercise.      Relevant Orders  Comprehensive metabolic panel   Hemoglobin A1c   Lipid panel   Obesity, morbid, BMI 40.0-49.9    Wt Readings from Last 3 Encounters:  11/08/14 271 lb 4 oz (123.038 kg)  10/11/14 283 lb 8 oz (128.595 kg)  11/05/13 275 lb 8 oz (124.966 kg)   Body mass index is 39.77 kg/(m^2). Congratulated pt on weight loss. Encouraged  continued healthy diet and exercise.          Return in about 3 months (around 02/07/2015) for Recheck of Diabetes.

## 2014-11-14 ENCOUNTER — Encounter: Payer: Self-pay | Admitting: *Deleted

## 2014-11-14 ENCOUNTER — Ambulatory Visit: Payer: Self-pay | Admitting: Internal Medicine

## 2014-11-14 LAB — HM MAMMOGRAPHY: HM Mammogram: NEGATIVE

## 2014-11-22 ENCOUNTER — Encounter: Payer: Self-pay | Admitting: Internal Medicine

## 2014-12-11 ENCOUNTER — Encounter: Payer: Self-pay | Admitting: Internal Medicine

## 2014-12-11 DIAGNOSIS — L309 Dermatitis, unspecified: Secondary | ICD-10-CM

## 2015-01-10 ENCOUNTER — Encounter: Payer: Self-pay | Admitting: Internal Medicine

## 2015-01-15 ENCOUNTER — Ambulatory Visit (INDEPENDENT_AMBULATORY_CARE_PROVIDER_SITE_OTHER): Payer: BC Managed Care – PPO | Admitting: *Deleted

## 2015-01-15 DIAGNOSIS — Z23 Encounter for immunization: Secondary | ICD-10-CM

## 2015-02-04 ENCOUNTER — Other Ambulatory Visit (INDEPENDENT_AMBULATORY_CARE_PROVIDER_SITE_OTHER): Payer: BC Managed Care – PPO

## 2015-02-04 ENCOUNTER — Other Ambulatory Visit: Payer: BC Managed Care – PPO

## 2015-02-04 DIAGNOSIS — E119 Type 2 diabetes mellitus without complications: Secondary | ICD-10-CM

## 2015-02-04 LAB — COMPREHENSIVE METABOLIC PANEL
ALBUMIN: 4 g/dL (ref 3.5–5.2)
ALK PHOS: 59 U/L (ref 39–117)
ALT: 9 U/L (ref 0–35)
AST: 15 U/L (ref 0–37)
BUN: 9 mg/dL (ref 6–23)
CALCIUM: 9.3 mg/dL (ref 8.4–10.5)
CO2: 30 mEq/L (ref 19–32)
Chloride: 103 mEq/L (ref 96–112)
Creatinine, Ser: 0.71 mg/dL (ref 0.40–1.20)
GFR: 94.93 mL/min (ref >60.00)
Glucose, Bld: 87 mg/dL (ref 70–99)
POTASSIUM: 3.8 meq/L (ref 3.5–5.1)
Sodium: 136 mEq/L (ref 135–145)
TOTAL PROTEIN: 7.2 g/dL (ref 6.0–8.3)
Total Bilirubin: 0.3 mg/dL (ref 0.2–1.2)

## 2015-02-04 LAB — LIPID PANEL
Cholesterol: 151 mg/dL (ref 0–200)
HDL: 39.8 mg/dL (ref >39.00)
LDL CALC: 72 mg/dL (ref 0–99)
NONHDL: 111.2
Total CHOL/HDL Ratio: 4
Triglycerides: 198 mg/dL — ABNORMAL HIGH (ref 0.0–149.0)
VLDL: 39.6 mg/dL (ref 0.0–40.0)

## 2015-02-04 LAB — HEMOGLOBIN A1C: HEMOGLOBIN A1C: 6.1 % (ref 4.6–6.5)

## 2015-02-07 ENCOUNTER — Ambulatory Visit (INDEPENDENT_AMBULATORY_CARE_PROVIDER_SITE_OTHER): Payer: BC Managed Care – PPO | Admitting: Internal Medicine

## 2015-02-07 ENCOUNTER — Encounter: Payer: Self-pay | Admitting: Internal Medicine

## 2015-02-07 VITALS — BP 106/72 | HR 83 | Temp 97.5°F | Ht 69.25 in | Wt 262.1 lb

## 2015-02-07 DIAGNOSIS — L409 Psoriasis, unspecified: Secondary | ICD-10-CM

## 2015-02-07 DIAGNOSIS — Z6830 Body mass index (BMI) 30.0-30.9, adult: Secondary | ICD-10-CM | POA: Diagnosis not present

## 2015-02-07 DIAGNOSIS — R739 Hyperglycemia, unspecified: Secondary | ICD-10-CM

## 2015-02-07 DIAGNOSIS — R7309 Other abnormal glucose: Secondary | ICD-10-CM | POA: Diagnosis not present

## 2015-02-07 NOTE — Assessment & Plan Note (Signed)
Symptoms improved with use of Clobetasol. Will continue.

## 2015-02-07 NOTE — Assessment & Plan Note (Signed)
Wt Readings from Last 3 Encounters:  02/07/15 262 lb 2 oz (118.899 kg)  11/08/14 271 lb 4 oz (123.038 kg)  10/11/14 283 lb 8 oz (128.595 kg)   Body mass index is 38.43 kg/(m^2). Encouraged healthy diet and exercise. Congratulated pt on weight loss.

## 2015-02-07 NOTE — Progress Notes (Signed)
Subjective:    Patient ID: Erica Roy, female    DOB: 06-28-1971, 43 y.o.   MRN: [id number]  HPI  43YO female presents for follow up.  Continues to follow healthy diet, increasing protein. Started exercising at local gym 2-3 times per week. Taking stairs at work. Feeling well. No concerns.   Lab Results  Component Value Date   HGBA1C 6.1 02/04/2015     Wt Readings from Last 3 Encounters:  02/07/15 262 lb 2 oz (118.899 kg)  11/08/14 271 lb 4 oz (123.038 kg)  10/11/14 283 lb 8 oz (128.595 kg)    Past medical, surgical, family and social history per today's encounter.  Review of Systems  Constitutional: Negative for fever, chills, appetite change, fatigue and unexpected weight change.  Eyes: Negative for visual disturbance.  Respiratory: Negative for shortness of breath.   Cardiovascular: Negative for chest pain and leg swelling.  Gastrointestinal: Negative for nausea, vomiting, abdominal pain, diarrhea and constipation.  Musculoskeletal: Negative for myalgias and arthralgias.  Skin: Negative for color change and rash.  Hematological: Negative for adenopathy. Does not bruise/bleed easily.  Psychiatric/Behavioral: Negative for dysphoric mood. The patient is not nervous/anxious.        Objective:    BP 106/72 mmHg  Pulse 83  Temp(Src) 97.5 F (36.4 C) (Oral)  Ht 5' 9.25" (1.759 m)  Wt 262 lb 2 oz (118.899 kg)  BMI 38.43 kg/m2  SpO2 98%  LMP 01/27/2015 Physical Exam  Constitutional: She is oriented to person, place, and time. She appears well-developed and well-nourished. No distress.  HENT:  Head: Normocephalic and atraumatic.  Right Ear: External ear normal.  Left Ear: External ear normal.  Nose: Nose normal.  Mouth/Throat: Oropharynx is clear and moist. No oropharyngeal exudate.  Eyes: Conjunctivae are normal. Pupils are equal, round, and reactive to light. Right eye exhibits no discharge. Left eye exhibits no discharge. No scleral icterus.  Neck:  Normal range of motion. Neck supple. No tracheal deviation present. No thyromegaly present.  Cardiovascular: Normal rate, regular rhythm, normal heart sounds and intact distal pulses.  Exam reveals no gallop and no friction rub.   No murmur heard. Pulmonary/Chest: Effort normal and breath sounds normal. No respiratory distress. She has no wheezes. She has no rales. She exhibits no tenderness.  Musculoskeletal: Normal range of motion. She exhibits no edema or tenderness.  Lymphadenopathy:    She has no cervical adenopathy.  Neurological: She is alert and oriented to person, place, and time. No cranial nerve deficit. She exhibits normal muscle tone. Coordination normal.  Skin: Skin is warm and dry. No rash noted. She is not diaphoretic. No erythema. No pallor.  Psychiatric: She has a normal mood and affect. Her behavior is normal. Judgment and thought content normal.          Assessment & Plan:   Problem List Items Addressed This Visit      Unprioritized   BMI 30.0-30.9,adult    Wt Readings from Last 3 Encounters:  02/07/15 262 lb 2 oz (118.899 kg)  11/08/14 271 lb 4 oz (123.038 kg)  10/11/14 283 lb 8 oz (128.595 kg)   Body mass index is 38.43 kg/(m^2). Encouraged healthy diet and exercise. Congratulated pt on weight loss.      Elevated blood sugar - Primary    Congratulated pt on improvement in blood sugars. Continue healthy diet and exercise. Follow up with repeat labs in 6 months.      Psoriasis of scalp    Symptoms  improved with use of Clobetasol. Will continue.          Return in about 6 months (around 08/09/2015) for Recheck of Diabetes.

## 2015-02-07 NOTE — Patient Instructions (Signed)
Look into Instant Pot.

## 2015-02-07 NOTE — Assessment & Plan Note (Signed)
Congratulated pt on improvement in blood sugars. Continue healthy diet and exercise. Follow up with repeat labs in 6 months.

## 2015-02-07 NOTE — Progress Notes (Signed)
Pre visit review using our clinic review tool, if applicable. No additional management support is needed unless otherwise documented below in the visit note. 

## 2015-07-15 ENCOUNTER — Ambulatory Visit (INDEPENDENT_AMBULATORY_CARE_PROVIDER_SITE_OTHER): Payer: BC Managed Care – PPO | Admitting: Surgical

## 2015-07-15 DIAGNOSIS — Z111 Encounter for screening for respiratory tuberculosis: Secondary | ICD-10-CM | POA: Diagnosis not present

## 2015-07-15 NOTE — Progress Notes (Signed)
PPD placed Rt Forearm, patient tolerated well. Patient coming back 07/17/15 for PPD read.

## 2015-07-17 ENCOUNTER — Ambulatory Visit (INDEPENDENT_AMBULATORY_CARE_PROVIDER_SITE_OTHER): Payer: BC Managed Care – PPO

## 2015-07-17 DIAGNOSIS — Z111 Encounter for screening for respiratory tuberculosis: Secondary | ICD-10-CM | POA: Diagnosis not present

## 2015-07-17 LAB — TB SKIN TEST
INDURATION: 0 mm
TB Skin Test: NEGATIVE

## 2015-07-17 NOTE — Progress Notes (Signed)
PAtient came in for PPD test reading.  Gave patient letter and immunization history per requested with results.

## 2015-08-11 ENCOUNTER — Ambulatory Visit: Payer: BC Managed Care – PPO | Admitting: Internal Medicine

## 2015-10-16 ENCOUNTER — Encounter: Payer: Self-pay | Admitting: Internal Medicine

## 2015-10-17 ENCOUNTER — Encounter: Payer: BC Managed Care – PPO | Admitting: Internal Medicine

## 2015-11-17 ENCOUNTER — Encounter: Payer: BC Managed Care – PPO | Admitting: Internal Medicine

## 2016-07-16 ENCOUNTER — Telehealth: Payer: Self-pay | Admitting: Internal Medicine

## 2016-07-16 DIAGNOSIS — Z1239 Encounter for other screening for malignant neoplasm of breast: Secondary | ICD-10-CM

## 2016-07-16 NOTE — Telephone Encounter (Signed)
Please advise and order if needed. thanks 

## 2016-07-16 NOTE — Telephone Encounter (Signed)
Pt would like to get a mammo done. Need order please and thank you!  Call pt @ [phone].

## 2016-07-17 NOTE — Telephone Encounter (Signed)
Ordered mammogram  Please call patient to schedule.  Also, would you abstract 2016 normal mammogram to health maintenance?   Please schedule est care appt with patient; she has not been seen since 09/2015.  Thanks!

## 2016-07-19 NOTE — Telephone Encounter (Signed)
Pt called back returning your call. Thank you!  Call @ [phone].

## 2016-07-19 NOTE — Telephone Encounter (Signed)
Left message for patient to return phone call.  

## 2016-07-20 ENCOUNTER — Ambulatory Visit (INDEPENDENT_AMBULATORY_CARE_PROVIDER_SITE_OTHER): Payer: BC Managed Care – PPO

## 2016-07-20 DIAGNOSIS — Z23 Encounter for immunization: Secondary | ICD-10-CM

## 2016-07-20 DIAGNOSIS — Z111 Encounter for screening for respiratory tuberculosis: Secondary | ICD-10-CM

## 2016-07-20 NOTE — Progress Notes (Signed)
Patient came in for TB skin test. Placed in Right arm intradermally.  Tolerated well, will return in 48 hours for reading.

## 2016-07-20 NOTE — Telephone Encounter (Signed)
Left detailed message informing patient to schedule appt to see margaret arnett to est care.  Also to call and schedule a mammogram and that ordefr has been placed

## 2016-07-22 ENCOUNTER — Ambulatory Visit (INDEPENDENT_AMBULATORY_CARE_PROVIDER_SITE_OTHER): Payer: BC Managed Care – PPO

## 2016-07-22 DIAGNOSIS — Z111 Encounter for screening for respiratory tuberculosis: Secondary | ICD-10-CM

## 2016-07-22 LAB — TB SKIN TEST
Induration: 0 mm
TB SKIN TEST: NEGATIVE

## 2016-07-22 NOTE — Progress Notes (Addendum)
[facility].  Patients read was negative 0MM. Patient had questions comments or concerns at this time.  Reviewed.   Dr Lorin PicketScott

## 2016-08-19 ENCOUNTER — Ambulatory Visit
Admission: RE | Admit: 2016-08-19 | Discharge: 2016-08-19 | Disposition: A | Discharge status: Home / Self Care | Payer: BC Managed Care – PPO | Source: Ambulatory Visit | Attending: Family | Admitting: Family

## 2016-08-19 DIAGNOSIS — Z1231 Encounter for screening mammogram for malignant neoplasm of breast: Secondary | ICD-10-CM | POA: Insufficient documentation

## 2016-08-19 DIAGNOSIS — R928 Other abnormal and inconclusive findings on diagnostic imaging of breast: Secondary | ICD-10-CM | POA: Diagnosis not present

## 2016-08-19 DIAGNOSIS — Z1239 Encounter for other screening for malignant neoplasm of breast: Secondary | ICD-10-CM

## 2016-08-20 ENCOUNTER — Other Ambulatory Visit: Payer: Self-pay | Admitting: Family

## 2016-08-20 DIAGNOSIS — N6489 Other specified disorders of breast: Secondary | ICD-10-CM

## 2016-08-26 ENCOUNTER — Encounter: Payer: BC Managed Care – PPO | Admitting: Family

## 2016-08-26 ENCOUNTER — Encounter: Payer: Self-pay | Admitting: Family

## 2016-09-02 ENCOUNTER — Ambulatory Visit
Admission: RE | Admit: 2016-09-02 | Discharge: 2016-09-02 | Disposition: A | Discharge status: Home / Self Care | Payer: BC Managed Care – PPO | Source: Ambulatory Visit | Attending: Family | Admitting: Family

## 2016-09-02 DIAGNOSIS — N6489 Other specified disorders of breast: Secondary | ICD-10-CM | POA: Diagnosis present

## 2016-09-02 DIAGNOSIS — R928 Other abnormal and inconclusive findings on diagnostic imaging of breast: Secondary | ICD-10-CM | POA: Insufficient documentation

## 2016-09-06 ENCOUNTER — Other Ambulatory Visit: Payer: Self-pay | Admitting: Family

## 2016-09-06 DIAGNOSIS — N6320 Unspecified lump in the left breast, unspecified quadrant: Secondary | ICD-10-CM

## 2016-09-09 ENCOUNTER — Other Ambulatory Visit: Payer: Self-pay | Admitting: Family

## 2016-09-09 DIAGNOSIS — N6320 Unspecified lump in the left breast, unspecified quadrant: Secondary | ICD-10-CM

## 2016-09-14 ENCOUNTER — Other Ambulatory Visit: Payer: Self-pay | Admitting: Family

## 2016-09-14 DIAGNOSIS — R928 Other abnormal and inconclusive findings on diagnostic imaging of breast: Secondary | ICD-10-CM

## 2016-09-28 ENCOUNTER — Encounter: Payer: BC Managed Care – PPO | Admitting: Family

## 2016-09-30 ENCOUNTER — Other Ambulatory Visit: Payer: Self-pay | Admitting: Family

## 2016-09-30 DIAGNOSIS — N6320 Unspecified lump in the left breast, unspecified quadrant: Secondary | ICD-10-CM

## 2016-10-26 ENCOUNTER — Encounter: Payer: Self-pay | Admitting: Internal Medicine

## 2016-11-29 ENCOUNTER — Encounter: Payer: Self-pay | Admitting: Family

## 2016-11-29 ENCOUNTER — Ambulatory Visit (INDEPENDENT_AMBULATORY_CARE_PROVIDER_SITE_OTHER): Payer: BC Managed Care – PPO | Admitting: Family

## 2016-11-29 DIAGNOSIS — Z6830 Body mass index (BMI) 30.0-30.9, adult: Secondary | ICD-10-CM | POA: Diagnosis not present

## 2016-11-29 NOTE — Progress Notes (Signed)
Pre visit review using our clinic review tool, if applicable. No additional management support is needed unless otherwise documented below in the visit note. 

## 2016-11-29 NOTE — Patient Instructions (Signed)
Pleasure meeting you  Left breast US 02/2017  Physical with fasting labs at your convenience.

## 2016-11-29 NOTE — Progress Notes (Signed)
Subjective:    Patient ID: Erica Roy, female    DOB: [birth year], 46 y.o.   MRN: [id number]  CC: Erica Roy is a 46 y.o. female who presents today for physical exam.    HPI: Feeling well today.   Remains frustrated by weight, working out when she can with 2 young children.  Abnormal left breast mammogram. scheduled repeat 6 month  Left [place] in May 2018.           HISTORY:  No past medical history on file.  Past Surgical History:  Procedure Laterality Date  . CESAREAN SECTION  2005 & 2007  . TUBAL LIGATION     Family History  Problem Relation Age of Onset  . Stroke Father 63  . Cancer Maternal Grandmother     post menopausal, breast with mets to bone  . Heart disease Maternal Grandmother   . Hypertension Maternal Grandmother   . Diabetes Maternal Grandmother   . Breast cancer Maternal Grandmother   . Thrombosis Paternal Grandmother   . Cancer Mother 43    endometrial  . Heart disease Maternal Grandfather   . Hypertension Maternal Grandfather   . Diabetes Maternal Grandfather   . Colon cancer Other     'not young.'   . Skin cancer Neg Hx       ALLERGIES: Patient has no known allergies.  Current Outpatient Prescriptions on File Prior to Visit  Medication Sig Dispense Refill  . Multiple Vitamin (MULTIVITAMIN) tablet Take 1 tablet by mouth daily.    . Probiotic Product (PROBIOTIC DAILY PO) Take by mouth.     No current facility-administered medications on file prior to visit.     Social History  Substance Use Topics  . Smoking status: Never Smoker  . Smokeless tobacco: Not on file  . Alcohol use No    Review of Systems  Constitutional: Negative for chills and fever.  Respiratory: Negative for cough.   Cardiovascular: Negative for chest pain and palpitations.  Gastrointestinal: Negative for nausea and vomiting.      Objective:    BP 105/78   Pulse 77   Temp 98 F (36.7 C) (Oral)   Ht 5\' 9"  (1.753 m)   SpO2 98%   BP Readings from  Last 3 Encounters:  11/29/16 105/78  02/07/15 106/72  11/08/14 120/74   Wt Readings from Last 3 Encounters:  02/07/15 262 lb 2 oz (118.9 kg)  11/08/14 271 lb 4 oz (123 kg)  10/11/14 283 lb 8 oz (128.6 kg)    Physical Exam  Constitutional: She appears well-developed and well-nourished.  Eyes: Conjunctivae are normal.  Cardiovascular: Normal rate, regular rhythm, normal heart sounds and normal pulses.   Pulmonary/Chest: Effort normal and breath sounds normal. She has no wheezes. She has no rhonchi. She has no rales.  Neurological: She is alert.  Skin: Skin is warm and dry.  Psychiatric: She has a normal mood and affect. Her speech is normal and behavior is normal. Thought content normal.  Vitals reviewed.      Assessment & Plan:   Problem List Items Addressed This Visit      Other   BMI 30.0-30.9,adult    Stable. Declined taking weight today. Working out when is able. Will continue to follow and draw fasting labs at [facility].           I have discontinued Ms. Shelden cholecalciferol, Methylsulfonylmethane (MSM PO), and Clobetasol Propionate. I am also having her maintain her multivitamin and Probiotic Product (  PROBIOTIC DAILY PO).   No orders of the defined types were placed in this encounter.   Return precautions given.   Risks, benefits, and alternatives of the medications and treatment plan prescribed today were discussed, and patient expressed understanding.   Education regarding symptom management and diagnosis given to patient on AVS.   Continue to follow with Rennie PlowmanMargaret Lilianna Case, FNP for routine health maintenance.   Kathleen ArgueJeanne B Justin and I agreed with plan.   Rennie PlowmanMargaret Nathifa Ritthaler, FNP

## 2016-11-29 NOTE — Assessment & Plan Note (Signed)
Stable. Declined taking weight today. Working out when is able. Will continue to follow and draw fasting labs at CPE.

## 2017-03-07 ENCOUNTER — Ambulatory Visit: Payer: BC Managed Care – PPO

## 2017-03-07 ENCOUNTER — Other Ambulatory Visit: Payer: BC Managed Care – PPO

## 2017-03-22 ENCOUNTER — Ambulatory Visit
Admission: RE | Admit: 2017-03-22 | Discharge: 2017-03-22 | Disposition: A | Discharge status: Home / Self Care | Payer: BC Managed Care – PPO | Source: Ambulatory Visit | Attending: Family | Admitting: Family

## 2017-03-22 DIAGNOSIS — N6320 Unspecified lump in the left breast, unspecified quadrant: Secondary | ICD-10-CM | POA: Diagnosis not present

## 2017-08-09 ENCOUNTER — Ambulatory Visit (INDEPENDENT_AMBULATORY_CARE_PROVIDER_SITE_OTHER): Payer: BC Managed Care – PPO

## 2017-08-09 ENCOUNTER — Ambulatory Visit: Payer: BC Managed Care – PPO

## 2017-08-09 DIAGNOSIS — Z111 Encounter for screening for respiratory tuberculosis: Secondary | ICD-10-CM

## 2017-08-11 ENCOUNTER — Ambulatory Visit: Payer: BC Managed Care – PPO

## 2017-08-12 LAB — TB SKIN TEST
Induration: 0 mm
TB SKIN TEST: NEGATIVE

## 2017-09-28 IMAGING — US US BREAST*L* LIMITED INC AXILLA
1 series · 1 of 1 positions shown · non-contrast
Comparison: Previous exam(s).

CLINICAL DATA: Callback from screening mammogram

EXAM:
2D DIGITAL DIAGNOSTIC LEFT MAMMOGRAM WITH CAD AND ADJUNCT TOMO
ULTRASOUND LEFT BREAST

[Series 1: us breast*left* limited inc axilla · 0.10mm/px · 1 of 1 slices shown]
[im 1/1]
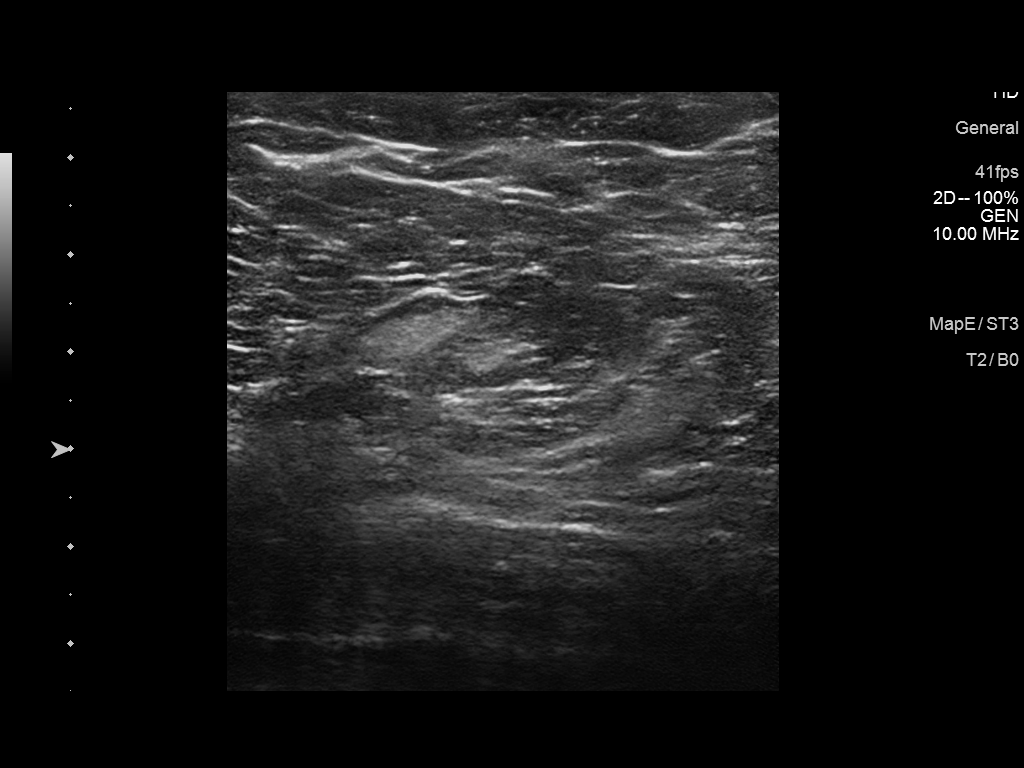

[1 of 1 positions shown; findings below may reference images not displayed]

ACR Breast Density Category c: The breast tissue is heterogeneously
dense, which may obscure small masses.
FINDINGS: Spot compression and full MLO views of the left breast were
performed with tomosynthesis. The possible abnormality identified on
screening mammogram is not identified on the repeat MLO imaging. Cc
and XCCL views of the left breast with tomosynthesis were also
performed. On the XCCL view, there is a 5 mm oval, circumscribed
mass within the posterior, far lateral left breast which may
correspond to the possible abnormality identified in the posterior
left breast on the MLO view above the screening mammogram.

Mammographic images were processed with CAD.

Targeted ultrasound of the medial and lateral left breast was
performed in the area of possible abnormality identified on
screening mammogram. No suspicious cystic or solid sonographic
finding was identified. No sonographic correlate was identified for
the oval circumscribed mass seen within the posterior, lateral left
breast on the XCCL view. This is thought to likely represent a
sonographically occult lymph node.
IMPRESSION: Probably benign left breast findings.

RECOMMENDATION:
Left diagnostic mammogram and possible ultrasound in 6 months.

I have discussed the findings and recommendations with the patient.
Results were also provided in writing at the conclusion of the
visit. If applicable, a reminder letter will be sent to the patient
regarding the next appointment.

BI-RADS CATEGORY  3: Probably benign.

## 2017-10-07 ENCOUNTER — Telehealth: Payer: Self-pay

## 2017-10-07 ENCOUNTER — Other Ambulatory Visit: Payer: Self-pay | Admitting: Internal Medicine

## 2017-10-07 DIAGNOSIS — N6320 Unspecified lump in the left breast, unspecified quadrant: Secondary | ICD-10-CM

## 2017-10-07 NOTE — Telephone Encounter (Signed)
Please advise 

## 2017-10-07 NOTE — Telephone Encounter (Signed)
Copied from CRM [id number]. Topic: Referral - Request >> Oct 07, 2017 11:48 AM Oneal GroutSebastian, Jennifer S wrote: Reason for CRM: [facility] m[facility] follow [facility].

## 2017-10-14 ENCOUNTER — Other Ambulatory Visit: Payer: Self-pay | Admitting: Internal Medicine

## 2017-10-14 DIAGNOSIS — N6320 Unspecified lump in the left breast, unspecified quadrant: Secondary | ICD-10-CM

## 2017-11-17 ENCOUNTER — Encounter: Payer: Self-pay | Admitting: Radiology

## 2017-11-17 ENCOUNTER — Ambulatory Visit
Admission: RE | Admit: 2017-11-17 | Discharge: 2017-11-17 | Disposition: A | Discharge status: Home / Self Care | Payer: BC Managed Care – PPO | Source: Ambulatory Visit | Attending: Internal Medicine | Admitting: Internal Medicine

## 2017-11-17 DIAGNOSIS — N6321 Unspecified lump in the left breast, upper outer quadrant: Secondary | ICD-10-CM | POA: Insufficient documentation

## 2017-11-17 DIAGNOSIS — N6320 Unspecified lump in the left breast, unspecified quadrant: Secondary | ICD-10-CM

## 2017-11-17 DIAGNOSIS — R928 Other abnormal and inconclusive findings on diagnostic imaging of breast: Secondary | ICD-10-CM | POA: Diagnosis not present

## 2017-11-24 ENCOUNTER — Telehealth: Payer: Self-pay

## 2017-11-24 NOTE — Telephone Encounter (Signed)
Patient wishes to transfer care from [name] ,NP to you .

## 2017-11-24 NOTE — Telephone Encounter (Signed)
Left voice mail to call back ok for [facility] to speak to patient 

## 2017-11-24 NOTE — Telephone Encounter (Signed)
That's fine   Thanks TMS

## 2017-12-01 NOTE — Telephone Encounter (Signed)
Mychart message sent.

## 2018-01-19 ENCOUNTER — Ambulatory Visit: Payer: BC Managed Care – PPO | Admitting: Internal Medicine

## 2018-01-26 ENCOUNTER — Ambulatory Visit: Payer: BC Managed Care – PPO | Admitting: Internal Medicine

## 2018-01-26 ENCOUNTER — Encounter: Payer: Self-pay | Admitting: Internal Medicine

## 2018-01-26 VITALS — BP 138/78 | HR 84 | Temp 98.2°F | Ht 69.0 in | Wt 277.0 lb

## 2018-01-26 DIAGNOSIS — Z1329 Encounter for screening for other suspected endocrine disorder: Secondary | ICD-10-CM | POA: Diagnosis not present

## 2018-01-26 DIAGNOSIS — Z1159 Encounter for screening for other viral diseases: Secondary | ICD-10-CM | POA: Diagnosis not present

## 2018-01-26 DIAGNOSIS — Z13818 Encounter for screening for other digestive system disorders: Secondary | ICD-10-CM | POA: Diagnosis not present

## 2018-01-26 DIAGNOSIS — E559 Vitamin D deficiency, unspecified: Secondary | ICD-10-CM

## 2018-01-26 DIAGNOSIS — N63 Unspecified lump in unspecified breast: Secondary | ICD-10-CM | POA: Diagnosis not present

## 2018-01-26 DIAGNOSIS — R7303 Prediabetes: Secondary | ICD-10-CM | POA: Diagnosis not present

## 2018-01-26 DIAGNOSIS — Z0000 Encounter for general adult medical examination without abnormal findings: Secondary | ICD-10-CM | POA: Diagnosis not present

## 2018-01-26 NOTE — Progress Notes (Signed)
Chief Complaint  Patient presents with  . Follow-up   Establish care doing well  1. Obesity weight always an issues gaol 200s-210 lowest was 190 hard to lose weight. She does exercise some  2. H/o prediabetes  3. H/o right back muscle spasm went to fast med given flexeril w/o relief  4. H/o abnormal mammogram left breast nodule 11/17/17 due for f/u in 6 months US and diag mammogram    Review of Systems  Constitutional: Negative for weight loss.  HENT: Negative for hearing loss.   Eyes: Negative for blurred vision.  Respiratory: Negative for shortness of breath.   Cardiovascular: Negative for chest pain.  Gastrointestinal: Negative for abdominal pain.  Musculoskeletal:       +right back muscle spasm at times  Skin: Negative for rash.  Neurological: Negative for headaches.  Psychiatric/Behavioral: Negative for depression.   Past Medical History:  Diagnosis Date  . Prediabetes   . Psoriasis    scalp Dr. Marthann SchillerK [place]    Past Surgical History:  Procedure Laterality Date  . CESAREAN SECTION  2005 & 2007  . TUBAL LIGATION     Family History  Problem Relation Age of Onset  . Stroke Father 764  . Cancer Maternal Grandmother        post menopausal, breast with mets to bone  . Heart disease Maternal Grandmother   . Hypertension Maternal Grandmother   . Diabetes Maternal Grandmother   . Breast cancer Maternal Grandmother        late 5660's  . Thrombosis Paternal Grandmother   . Cancer Mother 6066       endometrial  . Heart disease Maternal Grandfather   . Hypertension Maternal Grandfather   . Diabetes Maternal Grandfather   . Colon cancer Other        'not young.'   . Skin cancer Neg Hx    Social History   Socioeconomic History  . Marital status: Married    Spouse name: Not on file  . Number of children: Not on file  . Years of education: Not on file  . Highest education level: Not on file  Occupational History  . Not on file  Social Needs  . Financial resource strain:  Not on file  . Food insecurity:    Worry: Not on file    Inability: Not on file  . Transportation needs:    Medical: Not on file    Non-medical: Not on file  Tobacco Use  . Smoking status: Never Smoker  . Smokeless tobacco: Never Used  Substance and Sexual Activity  . Alcohol use: No  . Drug use: No  . Sexual activity: Yes  Lifestyle  . Physical activity:    Days per week: Not on file    Minutes per session: Not on file  . Stress: Not on file  Relationships  . Social connections:    Talks on phone: Not on file    Gets together: Not on file    Attends religious service: Not on file    Active member of club or organization: Not on file    Attends meetings of clubs or organizations: Not on file    Relationship status: Not on file  . Intimate partner violence:    Fear of current or ex partner: Not on file    Emotionally abused: Not on file    Physically abused: Not on file    Forced sexual activity: Not on file  Other Topics Concern  . Not on  file  Social History Narrative   Lives in [place] with husband. 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 Multiple Vitamin (MULTIVITAMIN) tablet Take 1 tablet by mouth daily.  . Probiotic Product (PROBIOTIC DAILY PO) Take by mouth.   No Known Allergies No results found for this or any previous visit (from the past 2160 hour(s)). Objective  Body mass index is 40.91 kg/m. Wt Readings from Last 3 Encounters:  01/26/18 277 lb (125.6 kg)  02/07/15 262 lb 2 oz (118.9 kg)  11/08/14 271 lb 4 oz (123 kg)   Temp Readings from Last 3 Encounters:  01/26/18 98.2 F (36.8 C) (Oral)  11/29/16 98 F (36.7 C) (Oral)  02/07/15 97.5 F (36.4 C) (Oral)   BP Readings from Last 3 Encounters:  01/26/18 138/78  11/29/16 105/78  02/07/15 106/72   Pulse Readings from Last 3 Encounters:  01/26/18 84  11/29/16 77  02/07/15 83    Physical Exam   Constitutional: She is oriented to person, place, and time and well-developed, well-nourished, and in no distress. Vital signs are normal.  HENT:  Head: Normocephalic and atraumatic.  Mouth/Throat: Oropharynx is clear and moist and mucous membranes are normal.  Eyes: Pupils are equal, round, and reactive to light. Conjunctivae are normal.  Cardiovascular: Normal rate, regular rhythm and normal heart sounds.  Pulmonary/Chest: Effort normal and breath sounds normal.  Neurological: She is alert and oriented to person, place, and time. Gait normal. Gait normal.  Skin: Skin is warm and dry. No rash noted.  Psychiatric: Mood, memory, affect and judgment normal.  Nursing note and vitals reviewed.   Assessment   1. H/o prediabetes  2. H/o right back muscle spasm  3. H/o left breast nodule noted 11/17/17 mammogram 4. HM Plan  1.  Disc exercise to lose Check labs before next f/u and next f/u due annual exam 2.  Monitor, heat, NSAIDs, Tylenol 3.  Referred mammogram left dx mammo/US 4.  Flu had 07/2017 Tdap UTD Check hep B status   Referred dx mammo/us L breast 05/17/18 Do pap at f/u  Saw Dr. 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 French Ana McLean-Scocuzza-Internal Medicine

## 2018-01-26 NOTE — Patient Instructions (Signed)
Please sch labs w/in the next 1-2 weeks fasting and f/u with me for annual physical w/in the next month  Take care   Exercising to Lose Weight Exercising can help you to lose weight. In order to lose weight through exercise, you need to do vigorous-intensity exercise. You can tell that you are exercising with vigorous intensity if you are breathing very hard and fast and cannot hold a conversation while exercising. Moderate-intensity exercise helps to maintain your current weight. You can tell that you are exercising at a moderate level if you have a higher heart rate and faster breathing, but you are still able to hold a conversation. How often should I exercise? Choose an activity that you enjoy and set realistic goals. Your health care provider can help you to make an activity plan that works for you. Exercise regularly as directed by your health care provider. This may include:  Doing resistance training twice each week, such as: ? Push-ups. ? Sit-ups. ? Lifting weights. ? Using resistance bands.  Doing a given intensity of exercise for a given amount of time. Choose from these options: ? 150 minutes of moderate-intensity exercise every week. ? 75 minutes of vigorous-intensity exercise every week. ? A mix of moderate-intensity and vigorous-intensity exercise every week.  Children, pregnant women, people who are out of shape, people who are overweight, and older adults may need to consult a health care provider for individual recommendations. If you have any sort of medical condition, be sure to consult your health care provider before starting a new exercise program. What are some activities that can help me to lose weight?  Walking at a rate of at least 4.5 miles an hour.  Jogging or running at a rate of 5 miles per hour.  Biking at a rate of at least 10 miles per hour.  Lap swimming.  Roller-skating or in-line skating.  Cross-country skiing.  Vigorous competitive sports, such  as football, basketball, and soccer.  Jumping rope.  Aerobic dancing. How can I be more active in my day-to-day activities?  Use the stairs instead of the elevator.  Take a walk during your lunch break.  If you drive, park your car farther away from work or school.  If you take public transportation, get off one stop early and walk the rest of the way.  Make all of your phone calls while standing up and walking around.  Get up, stretch, and walk around every 30 minutes throughout the day. What guidelines should I follow while exercising?  Do not exercise so much that you hurt yourself, feel dizzy, or get very short of breath.  Consult your health care provider prior to starting a new exercise program.  Wear comfortable clothes and shoes with good support.  Drink plenty of water while you exercise to prevent dehydration or heat stroke. Body water is lost during exercise and must be replaced.  Work out until you breathe faster and your heart beats faster. This information is not intended to replace advice given to you by your health care provider. Make sure you discuss any questions you have with your health care provider. Document Released: 11/13/2010 Document Revised: 03/18/2016 Document Reviewed: 03/14/2014 Elsevier Interactive Patient Education  [employer]2018 [employer]

## 2018-01-26 NOTE — Progress Notes (Signed)
Pre visit review using our clinic review tool, if applicable. No additional management support is needed unless otherwise documented below in the visit note. 

## 2018-02-02 ENCOUNTER — Telehealth: Payer: Self-pay | Admitting: *Deleted

## 2018-02-02 ENCOUNTER — Other Ambulatory Visit (INDEPENDENT_AMBULATORY_CARE_PROVIDER_SITE_OTHER): Payer: BC Managed Care – PPO

## 2018-02-02 DIAGNOSIS — Z1159 Encounter for screening for other viral diseases: Secondary | ICD-10-CM

## 2018-02-02 DIAGNOSIS — E559 Vitamin D deficiency, unspecified: Secondary | ICD-10-CM

## 2018-02-02 DIAGNOSIS — Z1329 Encounter for screening for other suspected endocrine disorder: Secondary | ICD-10-CM | POA: Diagnosis not present

## 2018-02-02 DIAGNOSIS — R7303 Prediabetes: Secondary | ICD-10-CM

## 2018-02-02 DIAGNOSIS — Z13818 Encounter for screening for other digestive system disorders: Secondary | ICD-10-CM

## 2018-02-02 DIAGNOSIS — Z0000 Encounter for general adult medical examination without abnormal findings: Secondary | ICD-10-CM | POA: Diagnosis not present

## 2018-02-02 LAB — HEMOGLOBIN A1C: HEMOGLOBIN A1C: 6.5 % (ref 4.6–6.5)

## 2018-02-02 LAB — CBC WITH DIFFERENTIAL/PLATELET
BASOS ABS: 0 10*3/uL (ref 0.0–0.1)
BASOS PCT: 0.5 % (ref 0.0–3.0)
EOS ABS: 0.2 10*3/uL (ref 0.0–0.7)
Eosinophils Relative: 2.4 % (ref 0.0–5.0)
HCT: 36.7 % (ref 36.0–46.0)
Hemoglobin: 12.2 g/dL (ref 12.0–15.0)
LYMPHS ABS: 1.5 10*3/uL (ref 0.7–4.0)
LYMPHS PCT: 22.3 % (ref 12.0–46.0)
MCHC: 33.2 g/dL (ref 30.0–36.0)
MCV: 81.4 fl (ref 78.0–100.0)
MONO ABS: 0.4 10*3/uL (ref 0.1–1.0)
Monocytes Relative: 6.4 % (ref 3.0–12.0)
NEUTROS ABS: 4.7 10*3/uL (ref 1.4–7.7)
NEUTROS PCT: 68.4 % (ref 43.0–77.0)
PLATELETS: 330 10*3/uL (ref 150.0–400.0)
RBC: 4.51 Mil/uL (ref 3.87–5.11)
RDW: 14.1 % (ref 11.5–15.5)
WBC: 6.9 10*3/uL (ref 4.0–10.5)

## 2018-02-02 LAB — URINALYSIS, ROUTINE W REFLEX MICROSCOPIC
Bilirubin Urine: NEGATIVE
KETONES UR: NEGATIVE
LEUKOCYTES UA: NEGATIVE
Nitrite: NEGATIVE
PH: 5.5 (ref 5.0–8.0)
SPECIFIC GRAVITY, URINE: 1.025 (ref 1.000–1.030)
Total Protein, Urine: NEGATIVE
URINE GLUCOSE: NEGATIVE
UROBILINOGEN UA: 0.2 (ref 0.0–1.0)

## 2018-02-02 LAB — VITAMIN D 25 HYDROXY (VIT D DEFICIENCY, FRACTURES): VITD: 15.23 ng/mL — ABNORMAL LOW (ref 30.00–100.00)

## 2018-02-02 LAB — LIPID PANEL
CHOL/HDL RATIO: 4
CHOLESTEROL: 154 mg/dL (ref 0–200)
HDL: 37.8 mg/dL — ABNORMAL LOW (ref >39.00)
LDL CALC: 89 mg/dL (ref 0–99)
NonHDL: 116.46
TRIGLYCERIDES: 135 mg/dL (ref 0.0–149.0)
VLDL: 27 mg/dL (ref 0.0–40.0)

## 2018-02-02 LAB — T4, FREE: FREE T4: 0.85 ng/dL (ref 0.60–1.60)

## 2018-02-02 LAB — COMPREHENSIVE METABOLIC PANEL
ALT: 8 U/L (ref 0–35)
AST: 10 U/L (ref 0–37)
Albumin: 4 g/dL (ref 3.5–5.2)
Alkaline Phosphatase: 55 U/L (ref 39–117)
BILIRUBIN TOTAL: 0.5 mg/dL (ref 0.2–1.2)
BUN: 13 mg/dL (ref 6–23)
CALCIUM: 8.9 mg/dL (ref 8.4–10.5)
CHLORIDE: 106 meq/L (ref 96–112)
CO2: 28 meq/L (ref 19–32)
CREATININE: 0.79 mg/dL (ref 0.40–1.20)
GFR: 82.82 mL/min (ref >60.00)
GLUCOSE: 102 mg/dL — AB (ref 70–99)
Potassium: 4.1 mEq/L (ref 3.5–5.1)
Sodium: 135 mEq/L (ref 135–145)
Total Protein: 6.9 g/dL (ref 6.0–8.3)

## 2018-02-02 LAB — TSH: TSH: 1.05 u[IU]/mL (ref 0.35–4.50)

## 2018-02-02 NOTE — Telephone Encounter (Signed)
Noted on cycle for UA  TMS

## 2018-02-02 NOTE — Telephone Encounter (Signed)
Pt came in for labs this for and a urinalysis with micro. Pt collected a urine specimen but wanted to let you know that she is currently on her menstrual cycle so you would be alarmed.

## 2018-02-03 LAB — HEPATITIS B SURFACE ANTIBODY, QUANTITATIVE: Hepatitis B-Post: 239 m[IU]/mL (ref >=10)

## 2018-02-08 ENCOUNTER — Other Ambulatory Visit: Payer: Self-pay | Admitting: Internal Medicine

## 2018-02-08 DIAGNOSIS — E559 Vitamin D deficiency, unspecified: Secondary | ICD-10-CM

## 2018-02-08 DIAGNOSIS — R319 Hematuria, unspecified: Secondary | ICD-10-CM

## 2018-02-08 MED ORDER — CHOLECALCIFEROL 1.25 MG (50000 UT) PO CAPS: 50000.0000 [IU] | ORAL_CAPSULE | ORAL | 1 refills | Status: DC

## 2018-02-27 ENCOUNTER — Encounter: Payer: Self-pay | Admitting: Internal Medicine

## 2018-02-27 ENCOUNTER — Other Ambulatory Visit (HOSPITAL_COMMUNITY)
Admission: RE | Admit: 2018-02-27 | Discharge: 2018-02-27 | Disposition: A | Discharge status: Home / Self Care | Payer: BC Managed Care – PPO | Source: Ambulatory Visit | Attending: Internal Medicine | Admitting: Internal Medicine

## 2018-02-27 ENCOUNTER — Ambulatory Visit (INDEPENDENT_AMBULATORY_CARE_PROVIDER_SITE_OTHER): Payer: BC Managed Care – PPO | Admitting: Internal Medicine

## 2018-02-27 VITALS — BP 124/70 | HR 70 | Temp 98.4°F | Ht 69.0 in | Wt 271.0 lb

## 2018-02-27 DIAGNOSIS — Z6841 Body Mass Index (BMI) 40.0 and over, adult: Secondary | ICD-10-CM

## 2018-02-27 DIAGNOSIS — Z1151 Encounter for screening for human papillomavirus (HPV): Secondary | ICD-10-CM | POA: Insufficient documentation

## 2018-02-27 DIAGNOSIS — Z124 Encounter for screening for malignant neoplasm of cervix: Secondary | ICD-10-CM | POA: Insufficient documentation

## 2018-02-27 DIAGNOSIS — E119 Type 2 diabetes mellitus without complications: Secondary | ICD-10-CM | POA: Diagnosis not present

## 2018-02-27 DIAGNOSIS — E559 Vitamin D deficiency, unspecified: Secondary | ICD-10-CM

## 2018-02-27 DIAGNOSIS — Z0000 Encounter for general adult medical examination without abnormal findings: Secondary | ICD-10-CM | POA: Diagnosis not present

## 2018-02-27 LAB — HM DIABETES EYE EXAM

## 2018-02-27 MED ORDER — CHOLECALCIFEROL 1.25 MG (50000 UT) PO CAPS: 50000.0000 [IU] | ORAL_CAPSULE | ORAL | 1 refills | Status: AC

## 2018-02-27 NOTE — Progress Notes (Signed)
Pre visit review using our clinic review tool, if applicable. No additional management support is needed unless otherwise documented below in the visit note. 

## 2018-02-27 NOTE — Patient Instructions (Addendum)
A1C 05/25/18 labs visit  F/u in 3-6 months with me  Take care and keep up the good work losing weight   Diabetes Mellitus and Nutrition When you have diabetes (diabetes mellitus), it is very important to have healthy eating habits because your blood sugar (glucose) levels are greatly affected by what you eat and drink. Eating healthy foods in the appropriate amounts, at about the same times every day, can help you:  Control your blood glucose.  Lower your risk of heart disease.  Improve your blood pressure.  Reach or maintain a healthy weight.  Every person with diabetes is different, and each person has different needs for a meal plan. Your health care provider may recommend that you work with a diet and nutrition specialist (dietitian) to make a meal plan that is best for you. Your meal plan may vary depending on factors such as:  The calories you need.  The medicines you take.  Your weight.  Your blood glucose, blood pressure, and cholesterol levels.  Your activity level.  Other health conditions you have, such as heart or kidney disease.  How do carbohydrates affect me? Carbohydrates affect your blood glucose level more than any other type of food. Eating carbohydrates naturally increases the amount of glucose in your blood. Carbohydrate counting is a method for keeping track of how many carbohydrates you eat. Counting carbohydrates is important to keep your blood glucose at a healthy level, especially if you use insulin or take certain oral diabetes medicines. It is important to know how many carbohydrates you can safely have in each meal. This is different for every person. Your dietitian can help you calculate how many carbohydrates you should have at each meal and for snack. Foods that contain carbohydrates include:  Bread, cereal, rice, pasta, and crackers.  Potatoes and corn.  Peas, beans, and lentils.  Milk and yogurt.  Fruit and juice.  Desserts, such as cakes,  cookies, ice cream, and candy.  How does alcohol affect me? Alcohol can cause a sudden decrease in blood glucose (hypoglycemia), especially if you use insulin or take certain oral diabetes medicines. Hypoglycemia can be a life-threatening condition. Symptoms of hypoglycemia (sleepiness, dizziness, and confusion) are similar to symptoms of having too much alcohol. If your health care provider says that alcohol is safe for you, follow these guidelines:  Limit alcohol intake to no more than 1 drink per day for nonpregnant women and 2 drinks per day for men. One drink equals 12 oz of beer, 5 oz of wine, or 1 oz of hard liquor.  Do not drink on an empty stomach.  Keep yourself hydrated with water, diet soda, or unsweetened iced tea.  Keep in mind that regular soda, juice, and other mixers may contain a lot of sugar and must be counted as carbohydrates.  What are tips for following this plan? Reading food labels  Start by checking the serving size on the label. The amount of calories, carbohydrates, fats, and other nutrients listed on the label are based on one serving of the food. Many foods contain more than one serving per package.  Check the total grams (g) of carbohydrates in one serving. You can calculate the number of servings of carbohydrates in one serving by dividing the total carbohydrates by 15. For example, if a food has 30 g of total carbohydrates, it would be equal to 2 servings of carbohydrates.  Check the number of grams (g) of saturated and trans fats in one serving. Choose  foods that have low or no amount of these fats.  Check the number of milligrams (mg) of sodium in one serving. Most people should limit total sodium intake to less than 2,300 mg per day.  Always check the nutrition information of foods labeled as "low-fat" or "nonfat". These foods may be higher in added sugar or refined carbohydrates and should be avoided.  Talk to your dietitian to identify your daily  goals for nutrients listed on the label. Shopping  Avoid buying canned, premade, or processed foods. These foods tend to be high in fat, sodium, and added sugar.  Shop around the outside edge of the grocery store. This includes fresh fruits and vegetables, bulk grains, fresh meats, and fresh dairy. Cooking  Use low-heat cooking methods, such as baking, instead of high-heat cooking methods like deep frying.  Cook using healthy oils, such as olive, canola, or sunflower oil.  Avoid cooking with butter, cream, or high-fat meats. Meal planning  Eat meals and snacks regularly, preferably at the same times every day. Avoid going long periods of time without eating.  Eat foods high in fiber, such as fresh fruits, vegetables, beans, and whole grains. Talk to your dietitian about how many servings of carbohydrates you can eat at each meal.  Eat 4-6 ounces of lean protein each day, such as lean meat, chicken, fish, eggs, or tofu. 1 ounce is equal to 1 ounce of meat, chicken, or fish, 1 egg, or 1/4 cup of tofu.  Eat some foods each day that contain healthy fats, such as avocado, nuts, seeds, and fish. Lifestyle   Check your blood glucose regularly.  Exercise at least 30 minutes 5 or more days each week, or as told by your health care provider.  Take medicines as told by your health care provider.  Do not use any products that contain nicotine or tobacco, such as cigarettes and e-cigarettes. If you need help quitting, ask your health care provider.  Work with a Veterinary surgeon or diabetes educator to identify strategies to manage stress and any emotional and social challenges. What are some questions to ask my health care provider?  Do I need to meet with a diabetes educator?  Do I need to meet with a dietitian?  What number can I call if I have questions?  When are the best times to check my blood glucose? Where to find more information:  American Diabetes Association:  diabetes.org/food-and-fitness/food  Academy of Nutrition and Dietetics: https://www.vargas.com/  [employer] of Diabetes and Digestive and Kidney Diseases (NIH): FindJewelers.cz Summary  A healthy meal plan will help you control your blood glucose and maintain a healthy lifestyle.  Working with a diet and nutrition specialist (dietitian) can help you make a meal plan that is best for you.  Keep in mind that carbohydrates and alcohol have immediate effects on your blood glucose levels. It is important to count carbohydrates and to use alcohol carefully. This information is not intended to replace advice given to you by your health care provider. Make sure you discuss any questions you have with your health care provider. Document Released: 07/08/2005 Document Revised: 11/15/2016 Document Reviewed: 11/15/2016 Elsevier Interactive Patient Education  2018 [employer].  Diabetes Mellitus and Exercise Exercising regularly is important for your overall health, especially when you have diabetes (diabetes mellitus). Exercising is not only about losing weight. It has many health benefits, such as increasing muscle strength and bone density and reducing body fat and stress. This leads to improved fitness, flexibility, and  endurance, all of which result in better overall health. Exercise has additional benefits for people with diabetes, including:  Reducing appetite.  Helping to lower and control blood glucose.  Lowering blood pressure.  Helping to control amounts of fatty substances (lipids) in the blood, such as cholesterol and triglycerides.  Helping the body to respond better to insulin (improving insulin sensitivity).  Reducing how much insulin the body needs.  Decreasing the risk for heart disease by: ? Lowering cholesterol and triglyceride levels. ? Increasing the  levels of good cholesterol. ? Lowering blood glucose levels.  What is my activity plan? Your health care provider or certified diabetes educator can help you make a plan for the type and frequency of exercise (activity plan) that works for you. Make sure that you:  Do at least 150 minutes of moderate-intensity or vigorous-intensity exercise each week. This could be brisk walking, biking, or water aerobics. ? Do stretching and strength exercises, such as yoga or weightlifting, at least 2 times a week. ? Spread out your activity over at least 3 days of the week.  Get some form of physical activity every day. ? Do not go more than 2 days in a row without some kind of physical activity. ? Avoid being inactive for more than 90 minutes at a time. Take frequent breaks to walk or stretch.  Choose a type of exercise or activity that you enjoy, and set realistic goals.  Start slowly, and gradually increase the intensity of your exercise over time.  What do I need to know about managing my diabetes?  Check your blood glucose before and after exercising. ? If your blood glucose is higher than 240 mg/dL (40.9 mmol/L) before you exercise, check your urine for ketones. If you have ketones in your urine, do not exercise until your blood glucose returns to normal.  Know the symptoms of low blood glucose (hypoglycemia) and how to treat it. Your risk for hypoglycemia increases during and after exercise. Common symptoms of hypoglycemia can include: ? Hunger. ? Anxiety. ? Sweating and feeling clammy. ? Confusion. ? Dizziness or feeling light-headed. ? Increased heart rate or palpitations. ? Blurry vision. ? Tingling or numbness around the mouth, lips, or tongue. ? Tremors or shakes. ? Irritability.  Keep a rapid-acting carbohydrate snack available before, during, and after exercise to help prevent or treat hypoglycemia.  Avoid injecting insulin into areas of the body that are going to be exercised.  For example, avoid injecting insulin into: ? The arms, when playing tennis. ? The legs, when jogging.  Keep records of your exercise habits. Doing this can help you and your health care provider adjust your diabetes management plan as needed. Write down: ? Food that you eat before and after you exercise. ? Blood glucose levels before and after you exercise. ? The type and amount of exercise you have done. ? When your insulin is expected to peak, if you use insulin. Avoid exercising at times when your insulin is peaking.  When you start a new exercise or activity, work with your health care provider to make sure the activity is safe for you, and to adjust your insulin, medicines, or food intake as needed.  Drink plenty of water while you exercise to prevent dehydration or heat stroke. Drink enough fluid to keep your urine clear or pale yellow. This information is not intended to replace advice given to you by your health care provider. Make sure you discuss any questions you have with your health  care provider. Document Released: 01/01/2004 Document Revised: 04/30/2016 Document Reviewed: 03/22/2016 Elsevier Interactive Patient Education  2018 [employer].

## 2018-02-27 NOTE — Progress Notes (Addendum)
Chief Complaint  Patient presents with  . Annual Exam   Annual exam  1. Reviewed labs A1C 6.5 DM 2 lost 6 lbs pt wants to try exercise and healthy diet choices for now saw [facility] recently and eye exam normal    Review of Systems  Constitutional: Positive for weight loss.  HENT: Negative for hearing loss.   Eyes: Negative for blurred vision.  Respiratory: Negative for shortness of breath.   Cardiovascular: Negative for chest pain.  Gastrointestinal: Negative for abdominal pain.  Musculoskeletal: Negative for falls.  Skin: Negative for rash.  Neurological: Negative for headaches.  Psychiatric/Behavioral: Negative for depression.   Past Medical History:  Diagnosis Date  . Prediabetes   . Psoriasis    scalp Dr. Marthann Schiller    Past Surgical History:  Procedure Laterality Date  . CESAREAN SECTION  2005 & 2007  . TUBAL LIGATION     Family History  Problem Relation Age of Onset  . Stroke Father 35  . Cancer Maternal Grandmother        post menopausal, breast with mets to bone  . Heart disease Maternal Grandmother   . Hypertension Maternal Grandmother   . Diabetes Maternal Grandmother   . Breast cancer Maternal Grandmother        late 36's  . Thrombosis Paternal Grandmother   . Cancer Mother 64       endometrial  . Heart disease Maternal Grandfather   . Hypertension Maternal Grandfather   . Diabetes Maternal Grandfather   . Colon cancer Other        'not young.'   . Skin cancer Neg Hx    Social History   Socioeconomic History  . Marital status: Married    Spouse name: Not on file  . Number of children: Not on file  . Years of education: Not on file  . Highest education level: Not on file  Occupational History  . Not on file  Social Needs  . Financial resource strain: Not on file  . Food insecurity:    Worry: Not on file    Inability: Not on file  . Transportation needs:    Medical: Not on file    Non-medical: Not on file  Tobacco Use  . Smoking  status: Never Smoker  . Smokeless tobacco: Never Used  Substance and Sexual Activity  . Alcohol use: No  . Drug use: No  . Sexual activity: Yes  Lifestyle  . Physical activity:    Days per week: Not on file    Minutes per session: Not on file  . Stress: Not on file  Relationships  . Social connections:    Talks on phone: Not on file    Gets together: Not on file    Attends religious service: Not on file    Active member of club or organization: Not on file    Attends meetings of clubs or organizations: Not on file    Relationship status: Not on file  . Intimate partner violence:    Fear of current or ex partner: Not on file    Emotionally abused: Not on file    Physically abused: Not on file    Forced sexual activity: Not on file  Other Topics Concern  . Not on file  Social History Narrative   Lives in [place] with husband. 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   No Known Allergies Recent Results (from the past 2160 hour(s))  Hepatitis B surface antibody     Status: None   Collection Time: 02/02/18  8:17 AM  Result Value Ref Range   Hepatitis B-Post 239 > OR = 10 mIU/mL    Comment: . Patient has immunity to hepatitis B virus. . For additional information, please refer to http://education.questdiagnostics.com/faq/FAQ105 (This link is being provided for informational/ educational purposes only).   Vitamin D (25 hydroxy)     Status: Abnormal   Collection Time: 02/02/18  8:17 AM  Result Value Ref Range   VITD 15.23 (L) 30.00 - 100.00 ng/mL  Hemoglobin A1c     Status: None   Collection Time: 02/02/18  8:17 AM  Result Value Ref Range   Hgb A1c MFr Bld 6.5 4.6 - 6.5 %    Comment: Glycemic Control Guidelines for People with Diabetes:Non Diabetic:  <6%Goal of Therapy: <7%Additional  Action Suggested:  >8%   T4, free     Status: None   Collection Time: 02/02/18  8:17 AM  Result Value Ref Range   Free T4 0.85 0.60 - 1.60 ng/dL    Comment: Specimens from patients who are undergoing biotin therapy and /or ingesting biotin supplements may contain high levels of biotin.  The higher biotin concentration in these specimens interferes with this Free T4 assay.  Specimens that contain high levels  of biotin may cause false high results for this Free T4 assay.  Please interpret results in light of the total clinical presentation of the patient.    TSH     Status: None   Collection Time: 02/02/18  8:17 AM  Result Value Ref Range   TSH 1.05 0.35 - 4.50 uIU/mL  Urinalysis, Routine w reflex microscopic     Status: Abnormal   Collection Time: 02/02/18  8:17 AM  Result Value Ref Range   Color, Urine YELLOW Yellow;Lt. Yellow   APPearance CLEAR Clear   Specific Gravity, Urine 1.025 1.000 - 1.030   pH 5.5 5.0 - 8.0   Total Protein, Urine NEGATIVE Negative   Urine Glucose NEGATIVE Negative   Ketones, ur NEGATIVE Negative   Bilirubin Urine NEGATIVE Negative   Hgb urine dipstick LARGE (A) Negative   Urobilinogen, UA 0.2 0.0 - 1.0   Leukocytes, UA NEGATIVE Negative   Nitrite NEGATIVE Negative   WBC, UA 0-2/hpf 0-2/hpf   RBC / HPF 0-2/hpf 0-2/hpf   Squamous Epithelial / LPF Rare(0-4/hpf) Rare(0-4/hpf)   Bacteria, UA Many(>50/hpf) (A) None  Lipid panel     Status: Abnormal   Collection Time: 02/02/18  8:17 AM  Result Value Ref Range   Cholesterol 154 0 - 200 mg/dL    Comment: ATP III Classification       Desirable:  < 200 mg/dL               Borderline High:  200 - 239 mg/dL          High:  > = 409 mg/dL   Triglycerides 811.9 0.0 - 149.0 mg/dL    Comment: Normal:  <147 mg/dLBorderline High:  150 - 199 mg/dL   HDL 82.95 (L) >62.13 mg/dL   VLDL 08.6 0.0 - 57.8 mg/dL   LDL Cholesterol 89 0 - 99 mg/dL   Total CHOL/HDL Ratio 4     Comment:  Men          Women1/2 Average  Risk     3.4          3.3Average Risk          5.0          4.42X Average Risk          9.6          7.13X Average Risk          15.0          11.0                       NonHDL 116.46     Comment: NOTE:  Non-HDL goal should be 30 mg/dL higher than patient's LDL goal (i.e. LDL goal of < 70 mg/dL, would have non-HDL goal of < 100 mg/dL)  CBC w/Diff     Status: None   Collection Time: 02/02/18  8:17 AM  Result Value Ref Range   WBC 6.9 4.0 - 10.5 K/uL   RBC 4.51 3.87 - 5.11 Mil/uL   Hemoglobin 12.2 12.0 - 15.0 g/dL   HCT 16.1 09.6 - 04.5 %   MCV 81.4 78.0 - 100.0 fl   MCHC 33.2 30.0 - 36.0 g/dL   RDW 40.9 81.1 - 91.4 %   Platelets 330.0 150.0 - 400.0 K/uL   Neutrophils Relative % 68.4 43.0 - 77.0 %   Lymphocytes Relative 22.3 12.0 - 46.0 %   Monocytes Relative 6.4 3.0 - 12.0 %   Eosinophils Relative 2.4 0.0 - 5.0 %   Basophils Relative 0.5 0.0 - 3.0 %   Neutro Abs 4.7 1.4 - 7.7 K/uL   Lymphs Abs 1.5 0.7 - 4.0 K/uL   Monocytes Absolute 0.4 0.1 - 1.0 K/uL   Eosinophils Absolute 0.2 0.0 - 0.7 K/uL   Basophils Absolute 0.0 0.0 - 0.1 K/uL  Comprehensive metabolic panel     Status: Abnormal   Collection Time: 02/02/18  8:17 AM  Result Value Ref Range   Sodium 135 135 - 145 mEq/L   Potassium 4.1 3.5 - 5.1 mEq/L   Chloride 106 96 - 112 mEq/L   CO2 28 19 - 32 mEq/L   Glucose, Bld 102 (H) 70 - 99 mg/dL   BUN 13 6 - 23 mg/dL   Creatinine, Ser 7.82 0.40 - 1.20 mg/dL   Total Bilirubin 0.5 0.2 - 1.2 mg/dL   Alkaline Phosphatase 55 39 - 117 U/L   AST 10 0 - 37 U/L   ALT 8 0 - 35 U/L   Total Protein 6.9 6.0 - 8.3 g/dL   Albumin 4.0 3.5 - 5.2 g/dL   Calcium 8.9 8.4 - 95.6 mg/dL   GFR 21.30 >86.57 mL/min   Objective  Body mass index is 40.02 kg/m. Wt Readings from Last 3 Encounters:  02/27/18 271 lb (122.9 kg)  01/26/18 277 lb (125.6 kg)  02/07/15 262 lb 2 oz (118.9 kg)   Temp Readings from Last 3 Encounters:  02/27/18 98.4 F (36.9 C) (Oral)  01/26/18 98.2 F (36.8 C) (Oral)   11/29/16 98 F (36.7 C) (Oral)   BP Readings from Last 3 Encounters:  02/27/18 124/70  01/26/18 138/78  11/29/16 105/78   Pulse Readings from Last 3 Encounters:  02/27/18 70  01/26/18 84  11/29/16 77    Physical Exam  Constitutional: She is oriented to person, place, and time. Vital signs are normal. She appears well-developed and well-nourished. She  is cooperative.  HENT:  Head: Normocephalic and atraumatic.  Mouth/Throat: Oropharynx is clear and moist and mucous membranes are normal.  Eyes: Pupils are equal, round, and reactive to light. Conjunctivae are normal.  Cardiovascular: Normal rate, regular rhythm and normal heart sounds.  Pulmonary/Chest: Effort normal and breath sounds normal. Right breast exhibits no inverted nipple, no mass, no nipple discharge, no skin change and no tenderness. Left breast exhibits no inverted nipple, no mass, no nipple discharge, no skin change and no tenderness. No breast swelling, tenderness, discharge or bleeding. Breasts are symmetrical.  Abdominal: Soft. Bowel sounds are normal. There is no tenderness.  Genitourinary: Vagina normal and uterus normal. Pelvic exam was performed with patient supine. There is no lesion on the right labia. There is no lesion on the left labia. Cervix exhibits friability. Cervix exhibits no motion tenderness and no discharge. Right adnexum displays no mass, no tenderness and no fullness. Left adnexum displays no mass, no tenderness and no fullness.  Neurological: She is alert and oriented to person, place, and time. Gait normal.  Skin: Skin is warm, dry and intact.  Psychiatric: She has a normal mood and affect. Her speech is normal and behavior is normal. Judgment and thought content normal. Cognition and memory are normal.  Nursing note and vitals reviewed.   Assessment   1. Annual exam  2. DM 2  3.vit D def  Plan  1. 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 Pt want to do diet and exercise for now no Metformin  Get records [facility] exam [facility]  -obtained 02/27/18 [facility] records no retinopathy F/u in 3-6 month A1C 05/25/18  Foot exam at f/u  3. rec take D3 weekly x 6 months  Provider: Dr. French Ana McLean-Scocuzza-Internal Medicine

## 2018-02-28 LAB — CYTOLOGY - PAP
DIAGNOSIS: NEGATIVE
HPV (WINDOPATH): NOT DETECTED

## 2018-05-12 ENCOUNTER — Ambulatory Visit
Admission: RE | Admit: 2018-05-12 | Discharge: 2018-05-12 | Disposition: A | Discharge status: Home / Self Care | Payer: BC Managed Care – PPO | Source: Ambulatory Visit | Attending: Internal Medicine | Admitting: Internal Medicine

## 2018-05-12 ENCOUNTER — Other Ambulatory Visit: Payer: Self-pay | Admitting: Internal Medicine

## 2018-05-12 DIAGNOSIS — N63 Unspecified lump in unspecified breast: Secondary | ICD-10-CM | POA: Insufficient documentation

## 2018-05-12 DIAGNOSIS — N6320 Unspecified lump in the left breast, unspecified quadrant: Secondary | ICD-10-CM

## 2018-05-18 ENCOUNTER — Other Ambulatory Visit: Payer: BC Managed Care – PPO

## 2018-05-25 ENCOUNTER — Other Ambulatory Visit: Payer: BC Managed Care – PPO

## 2018-06-30 ENCOUNTER — Ambulatory Visit: Payer: BC Managed Care – PPO | Admitting: Internal Medicine
